# Patient Record
Sex: Female | Born: 1975 | Race: Black or African American | Hispanic: No | Marital: Married | Smoking: Current every day smoker
Health system: Southern US, Community
[De-identification: ages and names within clinical notes are randomized; demographics above are authoritative.]

## PROBLEM LIST (undated history)

## (undated) DIAGNOSIS — I10 Essential (primary) hypertension: Secondary | ICD-10-CM

## (undated) DIAGNOSIS — G4730 Sleep apnea, unspecified: Secondary | ICD-10-CM

## (undated) HISTORY — PX: ABDOMINAL HYSTERECTOMY: SHX81

---

## 2006-08-07 ENCOUNTER — Emergency Department: Payer: Self-pay | Admitting: Emergency Medicine

## 2009-09-21 ENCOUNTER — Ambulatory Visit: Payer: Self-pay

## 2016-03-30 ENCOUNTER — Inpatient Hospital Stay
Admission: EM | Admit: 2016-03-30 | Discharge: 2016-04-04 | DRG: 918 | Disposition: A | Discharge status: Home / Self Care | Payer: Self-pay | Attending: Internal Medicine | Admitting: Internal Medicine

## 2016-03-30 ENCOUNTER — Encounter: Payer: Self-pay | Admitting: Emergency Medicine

## 2016-03-30 ENCOUNTER — Emergency Department: Payer: Self-pay

## 2016-03-30 DIAGNOSIS — I248 Other forms of acute ischemic heart disease: Secondary | ICD-10-CM | POA: Diagnosis present

## 2016-03-30 DIAGNOSIS — R079 Chest pain, unspecified: Secondary | ICD-10-CM

## 2016-03-30 DIAGNOSIS — F1410 Cocaine abuse, uncomplicated: Secondary | ICD-10-CM | POA: Diagnosis present

## 2016-03-30 DIAGNOSIS — R7989 Other specified abnormal findings of blood chemistry: Secondary | ICD-10-CM

## 2016-03-30 DIAGNOSIS — Z885 Allergy status to narcotic agent status: Secondary | ICD-10-CM

## 2016-03-30 DIAGNOSIS — Z7982 Long term (current) use of aspirin: Secondary | ICD-10-CM

## 2016-03-30 DIAGNOSIS — Z833 Family history of diabetes mellitus: Secondary | ICD-10-CM

## 2016-03-30 DIAGNOSIS — F17200 Nicotine dependence, unspecified, uncomplicated: Secondary | ICD-10-CM | POA: Diagnosis present

## 2016-03-30 DIAGNOSIS — R778 Other specified abnormalities of plasma proteins: Secondary | ICD-10-CM

## 2016-03-30 DIAGNOSIS — M6282 Rhabdomyolysis: Secondary | ICD-10-CM | POA: Diagnosis present

## 2016-03-30 DIAGNOSIS — G4730 Sleep apnea, unspecified: Secondary | ICD-10-CM | POA: Diagnosis present

## 2016-03-30 DIAGNOSIS — R609 Edema, unspecified: Secondary | ICD-10-CM

## 2016-03-30 DIAGNOSIS — T405X1A Poisoning by cocaine, accidental (unintentional), initial encounter: Principal | ICD-10-CM | POA: Diagnosis present

## 2016-03-30 DIAGNOSIS — R29898 Other symptoms and signs involving the musculoskeletal system: Secondary | ICD-10-CM

## 2016-03-30 DIAGNOSIS — Z888 Allergy status to other drugs, medicaments and biological substances status: Secondary | ICD-10-CM

## 2016-03-30 DIAGNOSIS — E876 Hypokalemia: Secondary | ICD-10-CM | POA: Diagnosis present

## 2016-03-30 DIAGNOSIS — K219 Gastro-esophageal reflux disease without esophagitis: Secondary | ICD-10-CM | POA: Diagnosis present

## 2016-03-30 DIAGNOSIS — D72829 Elevated white blood cell count, unspecified: Secondary | ICD-10-CM | POA: Diagnosis not present

## 2016-03-30 DIAGNOSIS — Z8249 Family history of ischemic heart disease and other diseases of the circulatory system: Secondary | ICD-10-CM

## 2016-03-30 HISTORY — DX: Sleep apnea, unspecified: G47.30

## 2016-03-30 LAB — HEPATIC FUNCTION PANEL
ALT: 42 U/L (ref 14–54)
AST: 125 U/L — ABNORMAL HIGH (ref 15–41)
Albumin: 4.5 g/dL (ref 3.5–5.0)
Alkaline Phosphatase: 81 U/L (ref 38–126)
BILIRUBIN DIRECT: 0.1 mg/dL (ref 0.1–0.5)
BILIRUBIN INDIRECT: 1.3 mg/dL — AB (ref 0.3–0.9)
BILIRUBIN TOTAL: 1.4 mg/dL — AB (ref 0.3–1.2)
Total Protein: 8.2 g/dL — ABNORMAL HIGH (ref 6.5–8.1)

## 2016-03-30 LAB — CBC
HCT: 41.4 % (ref 35.0–47.0)
Hemoglobin: 14.1 g/dL (ref 12.0–16.0)
MCH: 29.2 pg (ref 26.0–34.0)
MCHC: 34 g/dL (ref 32.0–36.0)
MCV: 86 fL (ref 80.0–100.0)
PLATELETS: 327 10*3/uL (ref 150–440)
RBC: 4.81 MIL/uL (ref 3.80–5.20)
RDW: 13.6 % (ref 11.5–14.5)
WBC: 22.6 10*3/uL — ABNORMAL HIGH (ref 3.6–11.0)

## 2016-03-30 LAB — BASIC METABOLIC PANEL
Anion gap: 12 (ref 5–15)
BUN: 13 mg/dL (ref 6–20)
CO2: 21 mmol/L — ABNORMAL LOW (ref 22–32)
Calcium: 8.9 mg/dL (ref 8.9–10.3)
Chloride: 103 mmol/L (ref 101–111)
Creatinine, Ser: 0.93 mg/dL (ref 0.44–1.00)
GFR calc non Af Amer: >60 mL/min (ref >60)
Glucose, Bld: 108 mg/dL — ABNORMAL HIGH (ref 65–99)
Potassium: 2.7 mmol/L — CL (ref 3.5–5.1)
SODIUM: 136 mmol/L (ref 135–145)

## 2016-03-30 LAB — APTT: APTT: 27 s (ref 24–36)

## 2016-03-30 LAB — PROTIME-INR
INR: 1.17
PROTHROMBIN TIME: 15.1 s — AB (ref 11.4–15.0)

## 2016-03-30 LAB — POCT PREGNANCY, URINE: PREG TEST UR: NEGATIVE

## 2016-03-30 LAB — TROPONIN I: Troponin I: 0.39 ng/mL — ABNORMAL HIGH (ref <0.031)

## 2016-03-30 MED ORDER — NITROGLYCERIN 2 % TD OINT
1.0000 [in_us] | TOPICAL_OINTMENT | Freq: Once | TRANSDERMAL | Status: AC
  Administered 2016-03-30: 1 [in_us] via TOPICAL
  Filled 2016-03-30: qty 1

## 2016-03-30 MED ORDER — HEPARIN SODIUM (PORCINE) 5000 UNIT/ML IJ SOLN: 4000.0000 [IU] | Freq: Once | INTRAMUSCULAR | Status: DC

## 2016-03-30 MED ORDER — HEPARIN (PORCINE) IN NACL 100-0.45 UNIT/ML-% IJ SOLN: 10.0000 [IU]/kg/h | Freq: Once | INTRAMUSCULAR | Status: DC

## 2016-03-30 MED ORDER — ASPIRIN 81 MG PO CHEW
324.0000 mg | CHEWABLE_TABLET | Freq: Once | ORAL | Status: AC
  Administered 2016-03-30: 324 mg via ORAL
  Filled 2016-03-30: qty 4

## 2016-03-30 MED ORDER — HEPARIN (PORCINE) IN NACL 100-0.45 UNIT/ML-% IJ SOLN
1500.0000 [IU]/h | INTRAMUSCULAR | Status: DC
  Administered 2016-03-31: 1000 [IU]/h via INTRAVENOUS
  Administered 2016-03-31: 1300 [IU]/h via INTRAVENOUS
  Filled 2016-03-30 (×4): qty 250

## 2016-03-30 MED ORDER — HEPARIN BOLUS VIA INFUSION
4000.0000 [IU] | Freq: Once | INTRAVENOUS | Status: AC
  Administered 2016-03-31: 4000 [IU] via INTRAVENOUS
  Filled 2016-03-30: qty 4000

## 2016-03-30 MED ORDER — POTASSIUM CHLORIDE 20 MEQ/15ML (10%) PO SOLN
40.0000 meq | Freq: Once | ORAL | Status: AC
  Administered 2016-03-31: 40 meq via ORAL
  Filled 2016-03-30 (×2): qty 30

## 2016-03-30 NOTE — ED Notes (Signed)
Pt says this is the first time using cocaine "in years"; says she used because "it was just something to do to have a little fun";

## 2016-03-30 NOTE — ED Notes (Signed)
K+ 2.7, Trop I 0.39

## 2016-03-30 NOTE — ED Provider Notes (Addendum)
[facility] Emergency Department Provider Note  ____________________________________________  Time seen: Approximately 11:14 PM  I have reviewed the triage vital signs and the nursing notes.   HISTORY  Chief Complaint Drug Problem   HPI Holly Ponce is a 40 y.o. female patient reports she's been doing cocaine all day long and now has burning pain in her chest. She also says she can't move her legs. She seems really fairly disinterested about the whole thing cannot tell me anything that makes the burning pain worse or better. All of this started today. Last cocaine was about 6:00 this evening.   History reviewed. No pertinent past medical history.  There are no active problems to display for this patient.   History reviewed. No pertinent past surgical history.  No current outpatient prescriptions on file.  Allergies Tramadol and Vicodin  History reviewed. No pertinent family history.  Social History Social History  Substance Use Topics  . Smoking status: Never Smoker   . Smokeless tobacco: None  . Alcohol Use: No    Review of Systems Constitutional: No fever/chills Eyes: No visual changes. ENT: No sore throat. Cardiovascular:  chest pain. Respiratory: Denies shortness of breath. Gastrointestinal: No abdominal pain.  No nausea, no vomiting.  No diarrhea.  No constipation. Genitourinary: Negative for dysuria. Musculoskeletal: Negative for back pain. Skin: Negative for rash. Neurological: Negative for headaches, No focal weakness except for the fact patient says she can't move either leg.Marland Kitchen  10-point ROS otherwise negative.  ____________________________________________   PHYSICAL EXAM:  VITAL SIGNS: ED Triage Vitals  Enc Vitals Group     BP 03/30/16 2110 155/93 mmHg     Pulse Rate 03/30/16 2110 98     Resp 03/30/16 2110 20     Temp 03/30/16 2110 98.6 F (37 C)     Temp Source 03/30/16 2110 Oral     SpO2 03/30/16 2110 97 %      Weight 03/30/16 2110 320 lb (145.151 kg)     Height 03/30/16 2110 5\' 5"  (1.651 m)     Head Cir --      Peak Flow --      Pain Score 03/30/16 2235 5     Pain Loc --      Pain Edu? --      Excl. in [place]? --    Constitutional: Alert and oriented. Well appearing and in no acute distress. Eyes: Conjunctivae are normal. PERRL. EOMI. Head: Atraumatic. Nose: No congestion/rhinnorhea. Mouth/Throat: Mucous membranes are moist.  Oropharynx non-erythematous. Neck: No stridor.  Cardiovascular: Normal rate, regular rhythm. Grossly normal heart sounds.  Good peripheral circulation. Respiratory: Normal respiratory effort.  No retractions. Lungs CTAB. Gastrointestinal: Soft and nontender. No distention. No abdominal bruits. No CVA tenderness. Musculoskeletal: Patient reports she cannot move her legs she does have intact deep tendon reflexes in the knees and ankles and toes are downgoing bilaterally. No joint effusions. Neurologic:  Normal speech and language. We'll strength in the arms. Skin:  Skin is warm, dry and intact. No rash noted.   ____________________________________________   LABS (all labs ordered are listed, but only abnormal results are displayed)  Labs Reviewed  CBC - Abnormal; Notable for the following:    WBC 22.6 (*)    All other components within normal limits  BASIC METABOLIC PANEL - Abnormal; Notable for the following:    Potassium 2.7 (*)    CO2 21 (*)    Glucose, Bld 108 (*)    All other components within normal  limits  TROPONIN I - Abnormal; Notable for the following:    Troponin I 0.39 (*)    All other components within normal limits  URINE DRUG SCREEN, QUALITATIVE ([facility] ONLY)  CK  PROTIME-INR  APTT  HEPATIC FUNCTION PANEL  PREGNANCY, URINE  URINALYSIS COMPLETEWITH MICROSCOPIC ([facility] ONLY)   ____________________________________________  Daylene Posey and interpreted by me shows that There appears to be some elevation in lead 1 and at least some of the complexes but an  not in any other leads. ____________________________________________  RADIOLOGY  ____________________________________________   PROCEDURES  Discussed patient with Dr. she cannot contract recommends heparin and aspirin and nitroglycerin he will consult in the morning.  ____________________________________________   INITIAL IMPRESSION / ASSESSMENT AND PLAN / ED COURSE  Pertinent labs & imaging results that were available during my care of the patient were reviewed by me and considered in my medical decision making (see chart for details).   ____________________________________________   FINAL CLINICAL IMPRESSION(S) / ED DIAGNOSES  Final diagnoses:  Chest pain, unspecified chest pain type  Elevated troponin  Cocaine abuse  Weakness of lower extremity, unspecified laterality      Nena Polio, MD 03/30/16 [id number]  Nena Polio, MD 04/09/16 1520

## 2016-03-30 NOTE — ED Notes (Signed)
Came in saying she overdosed on cocaine - last used 2 hrs ago. Alert, oriented. States her legs feel numb

## 2016-03-30 NOTE — ED Notes (Signed)
States first time using cocaine. Denies trying to hurt herself

## 2016-03-30 NOTE — Progress Notes (Signed)
ANTICOAGULATION CONSULT NOTE - Initial Consult  Pharmacy Consult for heparin Indication: chest pain/ACS  Allergies  Allergen Reactions  . Tramadol   . Vicodin [Hydrocodone-Acetaminophen]     Patient Measurements: Height: 5\' 5"  (165.1 cm) Weight: (!) 320 lb (145.151 kg) IBW/kg (Calculated) : 57 Heparin Dosing Weight: 93.4 kg  Vital Signs: Temp: 98.6 F (37 C) (04/16 2110) Temp Source: Oral (04/16 2110) BP: 120/93 mmHg (04/16 2323) Pulse Rate: 88 (04/16 2323)  Labs:  Recent Labs  03/30/16 2130  HGB 14.1  HCT 41.4  PLT 327  CREATININE 0.93  TROPONINI 0.39*    Estimated Creatinine Clearance: 119.5 mL/min (by C-G formula based on Cr of 0.93).   Medical History: History reviewed. No pertinent past medical history.  Medications:  Infusions:  . heparin      Assessment: 39 yof cc SUD. Recent cocaine use per patient, cardiology recommends heparin, troponin 0.39. Pharmacy consulted to dose heparin drip.   Goal of Therapy:  Heparin level 0.3-0.7 units/ml Monitor platelets by anticoagulation protocol: Yes   Plan:  Give 4000 units bolus x 1 Start heparin infusion at 1000 units/hr Check anti-Xa level in 6 hours and daily while on heparin Continue to monitor H&H and platelets  Laural Benes, Pharm.D., BCPS Clinical Pharmacist 03/30/2016,11:25 PM

## 2016-03-31 ENCOUNTER — Inpatient Hospital Stay: Payer: Self-pay

## 2016-03-31 DIAGNOSIS — M6282 Rhabdomyolysis: Secondary | ICD-10-CM | POA: Diagnosis present

## 2016-03-31 LAB — COMPREHENSIVE METABOLIC PANEL
ALT: 41 U/L (ref 14–54)
AST: 136 U/L — AB (ref 15–41)
Albumin: 3.8 g/dL (ref 3.5–5.0)
Alkaline Phosphatase: 68 U/L (ref 38–126)
Anion gap: 9 (ref 5–15)
BUN: 12 mg/dL (ref 6–20)
CHLORIDE: 108 mmol/L (ref 101–111)
CO2: 20 mmol/L — ABNORMAL LOW (ref 22–32)
CREATININE: 0.66 mg/dL (ref 0.44–1.00)
Calcium: 7.8 mg/dL — ABNORMAL LOW (ref 8.9–10.3)
GFR calc Af Amer: >60 mL/min (ref >60)
Glucose, Bld: 99 mg/dL (ref 65–99)
Potassium: 3 mmol/L — ABNORMAL LOW (ref 3.5–5.1)
Sodium: 137 mmol/L (ref 135–145)
Total Bilirubin: 1 mg/dL (ref 0.3–1.2)
Total Protein: 6.8 g/dL (ref 6.5–8.1)

## 2016-03-31 LAB — URINALYSIS COMPLETE WITH MICROSCOPIC (ARMC ONLY)
Bilirubin Urine: NEGATIVE
GLUCOSE, UA: NEGATIVE mg/dL
Leukocytes, UA: NEGATIVE
Nitrite: NEGATIVE
PROTEIN: 100 mg/dL — AB
SPECIFIC GRAVITY, URINE: 1.023 (ref 1.005–1.030)
pH: 5 (ref 5.0–8.0)

## 2016-03-31 LAB — URINE DRUG SCREEN, QUALITATIVE (ARMC ONLY)
AMPHETAMINES, UR SCREEN: NOT DETECTED
BARBITURATES, UR SCREEN: NOT DETECTED
BENZODIAZEPINE, UR SCRN: NOT DETECTED
COCAINE METABOLITE, UR ~~LOC~~: POSITIVE — AB
Cannabinoid 50 Ng, Ur ~~LOC~~: NOT DETECTED
MDMA (Ecstasy)Ur Screen: NOT DETECTED
Methadone Scn, Ur: NOT DETECTED
OPIATE, UR SCREEN: NOT DETECTED
PHENCYCLIDINE (PCP) UR S: NOT DETECTED
Tricyclic, Ur Screen: NOT DETECTED

## 2016-03-31 LAB — CBC
HEMATOCRIT: 35.8 % (ref 35.0–47.0)
HEMOGLOBIN: 12.1 g/dL (ref 12.0–16.0)
MCH: 28.8 pg (ref 26.0–34.0)
MCHC: 33.8 g/dL (ref 32.0–36.0)
MCV: 85.2 fL (ref 80.0–100.0)
PLATELETS: 299 10*3/uL (ref 150–440)
RBC: 4.2 MIL/uL (ref 3.80–5.20)
RDW: 13.9 % (ref 11.5–14.5)
WBC: 19 10*3/uL — AB (ref 3.6–11.0)

## 2016-03-31 LAB — CK
CK TOTAL: 11215 U/L — AB (ref 38–234)
Total CK: 11774 U/L — ABNORMAL HIGH (ref 38–234)

## 2016-03-31 LAB — GLUCOSE, CAPILLARY: GLUCOSE-CAPILLARY: 93 mg/dL (ref 65–99)

## 2016-03-31 LAB — HEPARIN LEVEL (UNFRACTIONATED)
HEPARIN UNFRACTIONATED: 0.11 [IU]/mL — AB (ref 0.30–0.70)
HEPARIN UNFRACTIONATED: 0.28 [IU]/mL — AB (ref 0.30–0.70)
Heparin Unfractionated: 0.2 IU/mL — ABNORMAL LOW (ref 0.30–0.70)

## 2016-03-31 LAB — PROTIME-INR
INR: 1.26
PROTHROMBIN TIME: 15.9 s — AB (ref 11.4–15.0)

## 2016-03-31 LAB — MAGNESIUM: MAGNESIUM: 2 mg/dL (ref 1.7–2.4)

## 2016-03-31 LAB — APTT: aPTT: 35 seconds (ref 24–36)

## 2016-03-31 MED ORDER — HEPARIN BOLUS VIA INFUSION
1400.0000 [IU] | Freq: Once | INTRAVENOUS | Status: AC
  Administered 2016-03-31: 1400 [IU] via INTRAVENOUS
  Filled 2016-03-31: qty 1400

## 2016-03-31 MED ORDER — ASPIRIN EC 81 MG PO TBEC
81.0000 mg | DELAYED_RELEASE_TABLET | Freq: Every day | ORAL | Status: DC
  Administered 2016-03-31 – 2016-04-04 (×5): 81 mg via ORAL
  Filled 2016-03-31 (×5): qty 1

## 2016-03-31 MED ORDER — NITROGLYCERIN 2 % TD OINT: 0.5000 [in_us] | TOPICAL_OINTMENT | Freq: Four times a day (QID) | TRANSDERMAL | Status: DC

## 2016-03-31 MED ORDER — SODIUM CHLORIDE 0.9 % IV SOLN
INTRAVENOUS | Status: DC
  Administered 2016-03-31 – 2016-04-04 (×12): via INTRAVENOUS

## 2016-03-31 MED ORDER — POTASSIUM CHLORIDE CRYS ER 20 MEQ PO TBCR
40.0000 meq | EXTENDED_RELEASE_TABLET | Freq: Once | ORAL | Status: AC
  Administered 2016-03-31: 40 meq via ORAL
  Filled 2016-03-31: qty 2

## 2016-03-31 MED ORDER — ONDANSETRON HCL 4 MG PO TABS: 4.0000 mg | ORAL_TABLET | Freq: Four times a day (QID) | ORAL | Status: DC | PRN

## 2016-03-31 MED ORDER — HEPARIN (PORCINE) IN NACL 100-0.45 UNIT/ML-% IJ SOLN
1650.0000 [IU]/h | INTRAMUSCULAR | Status: DC
Start: 2016-03-31 — End: 2016-04-01
  Administered 2016-04-01: 1650 [IU]/h via INTRAVENOUS
  Filled 2016-03-31: qty 250

## 2016-03-31 MED ORDER — ONDANSETRON HCL 4 MG/2ML IJ SOLN
4.0000 mg | Freq: Four times a day (QID) | INTRAMUSCULAR | Status: DC | PRN
Start: 2016-03-31 — End: 2016-04-04
  Administered 2016-04-01: 4 mg via INTRAVENOUS
  Filled 2016-03-31: qty 2

## 2016-03-31 MED ORDER — SODIUM CHLORIDE 0.9% FLUSH
3.0000 mL | Freq: Two times a day (BID) | INTRAVENOUS | Status: DC
  Administered 2016-03-31 – 2016-04-02 (×5): 3 mL via INTRAVENOUS

## 2016-03-31 MED ORDER — HEPARIN BOLUS VIA INFUSION
2800.0000 [IU] | Freq: Once | INTRAVENOUS | Status: AC
  Administered 2016-03-31: 2800 [IU] via INTRAVENOUS
  Filled 2016-03-31: qty 2800

## 2016-03-31 MED ORDER — SODIUM CHLORIDE 0.9 % IV SOLN
Freq: Once | INTRAVENOUS | Status: AC
  Administered 2016-03-31: via INTRAVENOUS

## 2016-03-31 MED ORDER — ADULT MULTIVITAMIN W/MINERALS CH
1.0000 | ORAL_TABLET | Freq: Every day | ORAL | Status: DC
  Administered 2016-03-31 – 2016-04-04 (×5): 1 via ORAL
  Filled 2016-03-31 (×5): qty 1

## 2016-03-31 NOTE — Progress Notes (Signed)
Holly Ponce is a 40 y.o. female  [id number]  Primary Cardiologist: Neoma Laming Reason for Consultation: Chest pain  HPI: 40 year old African-American female with a past medical history of cocaine use presented to the hospital with chest pain and leg pain. EKG was mildly abnormal normal with some ST changes and elevated troponin along with CPK thus I was asked to evaluate the patient.   Review of Systems: No orthopnea PND or leg swelling   Past Medical History  Diagnosis Date  . Sleep apnea     Medications Prior to Admission  Medication Sig Dispense Refill  . Multiple Vitamin (MULTIVITAMIN) tablet Take 1 tablet by mouth daily.       Marland Kitchen aspirin EC  81 mg Oral Daily  . multivitamin with minerals  1 tablet Oral Daily  . sodium chloride flush  3 mL Intravenous Q12H    Infusions: . sodium chloride 150 mL/hr at 03/31/16 0151  . heparin 1,300 Units/hr (03/31/16 0631)    Allergies  Allergen Reactions  . Tramadol   . Vicodin [Hydrocodone-Acetaminophen]     Social History   Social History  . Marital Status: Married    Spouse Name: N/A  . Number of Children: N/A  . Years of Education: N/A   Occupational History  . Not on file.   Social History Main Topics  . Smoking status: Never Smoker   . Smokeless tobacco: Not on file  . Alcohol Use: No  . Drug Use: Not on file  . Sexual Activity: Yes    Birth Control/ Protection: IUD   Other Topics Concern  . Not on file   Social History Narrative    History reviewed. No pertinent family history.  PHYSICAL EXAM: Filed Vitals:   03/31/16 0455 03/31/16 0800  BP: 125/65 119/79  Pulse: 81 83  Temp: 98.4 F (36.9 C) 98.2 F (36.8 C)  Resp: 16 17     Intake/Output Summary (Last 24 hours) at 03/31/16 0821 Last data filed at 03/31/16 0631  Gross per 24 hour  Intake   2740 ml  Output    300 ml  Net   2440 ml    General:  Well appearing. No respiratory difficulty HEENT: normal Neck: supple. no JVD. Carotids  2+ bilat; no bruits. No lymphadenopathy or thryomegaly appreciated. Cor: PMI nondisplaced. Regular rate & rhythm. No rubs, gallops or murmurs. Lungs: clear Abdomen: soft, nontender, nondistended. No hepatosplenomegaly. No bruits or masses. Good bowel sounds. Extremities: no cyanosis, clubbing, rash, edema Neuro: alert & oriented x 3, cranial nerves grossly intact. moves all 4 extremities w/o difficulty. Affect pleasant.  [id number] rhythm with minor ST changes in the lateral leads  Results for orders placed or performed during the hospital encounter of 03/30/16 (from the past 24 hour(s))  CBC     Status: Abnormal   Collection Time: 03/30/16  9:30 PM  Result Value Ref Range   WBC 22.6 (H) 3.6 - 11.0 K/uL   RBC 4.81 3.80 - 5.20 MIL/uL   Hemoglobin 14.1 12.0 - 16.0 g/dL   HCT 41.4 35.0 - 47.0 %   MCV 86.0 80.0 - 100.0 fL   MCH 29.2 26.0 - 34.0 pg   MCHC 34.0 32.0 - 36.0 g/dL   RDW 13.6 11.5 - 14.5 %   Platelets 327 150 - 440 K/uL  Basic metabolic panel     Status: Abnormal   Collection Time: 03/30/16  9:30 PM  Result Value Ref Range   Sodium 136 135 - 145  mmol/L   Potassium 2.7 (LL) 3.5 - 5.1 mmol/L   Chloride 103 101 - 111 mmol/L   CO2 21 (L) 22 - 32 mmol/L   Glucose, Bld 108 (H) 65 - 99 mg/dL   BUN 13 6 - 20 mg/dL   Creatinine, Ser 0.93 0.44 - 1.00 mg/dL   Calcium 8.9 8.9 - 10.3 mg/dL   GFR calc non Af Amer >60 >60 mL/min   GFR calc Af Amer >60 >60 mL/min   Anion gap 12 5 - 15  Troponin I     Status: Abnormal   Collection Time: 03/30/16  9:30 PM  Result Value Ref Range   Troponin I 0.39 (H) <0.031 ng/mL  CK     Status: Abnormal   Collection Time: 03/30/16  9:30 PM  Result Value Ref Range   Total CK 11215 (H) 38 - 234 U/L  Protime-INR     Status: Abnormal   Collection Time: 03/30/16  9:30 PM  Result Value Ref Range   Prothrombin Time 15.1 (H) 11.4 - 15.0 seconds   INR 1.17   APTT     Status: None   Collection Time: 03/30/16  9:30 PM  Result Value Ref Range   aPTT 27  24 - 36 seconds  Hepatic function panel     Status: Abnormal   Collection Time: 03/30/16  9:30 PM  Result Value Ref Range   Total Protein 8.2 (H) 6.5 - 8.1 g/dL   Albumin 4.5 3.5 - 5.0 g/dL   AST 125 (H) 15 - 41 U/L   ALT 42 14 - 54 U/L   Alkaline Phosphatase 81 38 - 126 U/L   Total Bilirubin 1.4 (H) 0.3 - 1.2 mg/dL   Bilirubin, Direct 0.1 0.1 - 0.5 mg/dL   Indirect Bilirubin 1.3 (H) 0.3 - 0.9 mg/dL  Urine Drug Screen, Qualitative ([facility] only)     Status: Abnormal   Collection Time: 03/30/16 11:47 PM  Result Value Ref Range   Tricyclic, Ur Screen NONE DETECTED NONE DETECTED   Amphetamines, Ur Screen NONE DETECTED NONE DETECTED   MDMA (Ecstasy)Ur Screen NONE DETECTED NONE DETECTED   Cocaine Metabolite,Ur [place] POSITIVE (A) NONE DETECTED   Opiate, Ur Screen NONE DETECTED NONE DETECTED   Phencyclidine (PCP) Ur S NONE DETECTED NONE DETECTED   Cannabinoid 50 Ng, Ur [place] NONE DETECTED NONE DETECTED   Barbiturates, Ur Screen NONE DETECTED NONE DETECTED   Benzodiazepine, Ur Scrn NONE DETECTED NONE DETECTED   Methadone Scn, Ur NONE DETECTED NONE DETECTED  Urinalysis complete, with microscopic     Status: Abnormal   Collection Time: 03/30/16 11:47 PM  Result Value Ref Range   Color, Urine YELLOW (A) YELLOW   APPearance HAZY (A) CLEAR   Glucose, UA NEGATIVE NEGATIVE mg/dL   Bilirubin Urine NEGATIVE NEGATIVE   Ketones, ur 2+ (A) NEGATIVE mg/dL   Specific Gravity, Urine 1.023 1.005 - 1.030   Hgb urine dipstick 2+ (A) NEGATIVE   pH 5.0 5.0 - 8.0   Protein, ur 100 (A) NEGATIVE mg/dL   Nitrite NEGATIVE NEGATIVE   Leukocytes, UA NEGATIVE NEGATIVE   RBC / HPF 0-5 0 - 5 RBC/hpf   WBC, UA 0-5 0 - 5 WBC/hpf   Bacteria, UA RARE (A) NONE SEEN   Squamous Epithelial / LPF 0-5 (A) NONE SEEN   Mucous PRESENT    Hyaline Casts, UA PRESENT    Granular Casts, UA PRESENT   Pregnancy, urine POC     Status: None   Collection Time:  03/30/16 11:52 PM  Result Value Ref Range   Preg Test, Ur NEGATIVE NEGATIVE   CK     Status: Abnormal   Collection Time: 03/31/16  2:22 AM  Result Value Ref Range   Total CK 11774 (H) 38 - 234 U/L  Heparin level (unfractionated)     Status: Abnormal   Collection Time: 03/31/16  4:43 AM  Result Value Ref Range   Heparin Unfractionated 0.11 (L) 0.30 - 0.70 IU/mL  CBC     Status: Abnormal   Collection Time: 03/31/16  4:43 AM  Result Value Ref Range   WBC 19.0 (H) 3.6 - 11.0 K/uL   RBC 4.20 3.80 - 5.20 MIL/uL   Hemoglobin 12.1 12.0 - 16.0 g/dL   HCT 35.8 35.0 - 47.0 %   MCV 85.2 80.0 - 100.0 fL   MCH 28.8 26.0 - 34.0 pg   MCHC 33.8 32.0 - 36.0 g/dL   RDW 13.9 11.5 - 14.5 %   Platelets 299 150 - 440 K/uL  Comprehensive metabolic panel     Status: Abnormal   Collection Time: 03/31/16  4:43 AM  Result Value Ref Range   Sodium 137 135 - 145 mmol/L   Potassium 3.0 (L) 3.5 - 5.1 mmol/L   Chloride 108 101 - 111 mmol/L   CO2 20 (L) 22 - 32 mmol/L   Glucose, Bld 99 65 - 99 mg/dL   BUN 12 6 - 20 mg/dL   Creatinine, Ser 0.66 0.44 - 1.00 mg/dL   Calcium 7.8 (L) 8.9 - 10.3 mg/dL   Total Protein 6.8 6.5 - 8.1 g/dL   Albumin 3.8 3.5 - 5.0 g/dL   AST 136 (H) 15 - 41 U/L   ALT 41 14 - 54 U/L   Alkaline Phosphatase 68 38 - 126 U/L   Total Bilirubin 1.0 0.3 - 1.2 mg/dL   GFR calc non Af Amer >60 >60 mL/min   GFR calc Af Amer >60 >60 mL/min   Anion gap 9 5 - 15  Protime-INR     Status: Abnormal   Collection Time: 03/31/16  4:43 AM  Result Value Ref Range   Prothrombin Time 15.9 (H) 11.4 - 15.0 seconds   INR 1.26   APTT     Status: None   Collection Time: 03/31/16  4:43 AM  Result Value Ref Range   aPTT 35 24 - 36 seconds  Magnesium     Status: None   Collection Time: 03/31/16  4:43 AM  Result Value Ref Range   Magnesium 2.0 1.7 - 2.4 mg/dL  Glucose, capillary     Status: None   Collection Time: 03/31/16  7:42 AM  Result Value Ref Range   Glucose-Capillary 93 65 - 99 mg/dL   Comment 1 Notify RN    Dg Chest Portable 1 View  03/31/2016  CLINICAL DATA:   Initial evaluation for acute chest pain. EXAM: PORTABLE CHEST 1 VIEW COMPARISON:  None. FINDINGS: Cardiac and mediastinal silhouettes are within normal limits. Elevation the right hemidiaphragm. Associated mild right basilar atelectasis. No consolidative airspace disease. No pulmonary edema or pleural effusion. No pneumothorax. No acute osseus abnormality. IMPRESSION: Elevation of the right hemidiaphragm with associated right basilar atelectasis/ bronchovascular crowding. No other active cardiopulmonary disease. Electronically Signed   By: Jeannine Boga M.D.   On: 03/31/2016 00:31     ASSESSMENT AND PLAN: Rhabdomyolysis with mildly elevated troponin. Patient probably has this due to rhabdomyolysis and will do echocardiogram to look at wall motion and ejection  fraction. Patient denies any chest pain at this time. Advise correcting the potassium which is low and advise giving IV fluid for elevated CPK.  KHAN,SHAUKAT A

## 2016-03-31 NOTE — Progress Notes (Signed)
[facility] [place] NAME: Holly Ponce    MR#:  [id number]  DATE OF BIRTH:  03-Apr-1976  SUBJECTIVE:  CHIEF COMPLAINT:   Chief Complaint  Patient presents with  . Drug Problem   Chest pain is better, but has lower body aching and unable to move legs. REVIEW OF SYSTEMS:  CONSTITUTIONAL: No fever, has weakness.  EYES: No blurred or double vision.  EARS, NOSE, AND THROAT: No tinnitus or ear pain.  RESPIRATORY: No cough, shortness of breath, wheezing or hemoptysis.  CARDIOVASCULAR: has chest pain, no orthopnea, edema.  GASTROINTESTINAL: No nausea, vomiting, diarrhea or abdominal pain.  GENITOURINARY: No dysuria, hematuria.  ENDOCRINE: No polyuria, nocturia,  HEMATOLOGY: No anemia, easy bruising or bleeding SKIN: No rash or lesion. MUSCULOSKELETAL: No joint pain or arthritis. But has muscle aching.  NEUROLOGIC: No tingling, numbness, weakness.  PSYCHIATRY: No anxiety or depression.   DRUG ALLERGIES:   Allergies  Allergen Reactions  . Tramadol   . Vicodin [Hydrocodone-Acetaminophen]     VITALS:  Blood pressure 119/82, pulse 83, temperature 97.9 F (36.6 C), temperature source Oral, resp. rate 20, height 5\' 5"  (1.651 m), weight 142.883 kg (315 lb), SpO2 95 %.  PHYSICAL EXAMINATION:  GENERAL:  40 y.o.-year-old patient lying in the bed with no acute distress. Morbid obese. EYES: Pupils equal, round, reactive to light and accommodation. No scleral icterus. Extraocular muscles intact.  HEENT: Head atraumatic, normocephalic. Oropharynx and nasopharynx clear.  NECK:  Supple, no jugular venous distention. No thyroid enlargement, no tenderness.  LUNGS: Normal breath sounds bilaterally, no wheezing, rales,rhonchi or crepitation. No use of accessory muscles of respiration.  CARDIOVASCULAR: S1, S2 normal. No murmurs, rubs, or gallops.  ABDOMEN: Soft, nontender, nondistended. Bowel sounds present. No organomegaly or mass.  EXTREMITIES:  No pedal edema, cyanosis, or clubbing.  NEUROLOGIC: Cranial nerves II through XII are intact. Muscle strength 2/5 in all extremities. Sensation intact. Gait not checked.  PSYCHIATRIC: The patient is alert and oriented x 3.  SKIN: No obvious rash, lesion, or ulcer.    LABORATORY PANEL:   CBC  Recent Labs Lab 03/31/16 0443  WBC 19.0*  HGB 12.1  HCT 35.8  PLT 299   ------------------------------------------------------------------------------------------------------------------  Chemistries   Recent Labs Lab 03/31/16 0443  NA 137  K 3.0*  CL 108  CO2 20*  GLUCOSE 99  BUN 12  CREATININE 0.66  CALCIUM 7.8*  MG 2.0  AST 136*  ALT 41  ALKPHOS 68  BILITOT 1.0   ------------------------------------------------------------------------------------------------------------------  Cardiac Enzymes  Recent Labs Lab 03/30/16 2130  TROPONINI 0.39*   ------------------------------------------------------------------------------------------------------------------  RADIOLOGY:  US Venous Img Lower Bilateral  03/31/2016  CLINICAL DATA:  Bilateral lower extremity pain and edema. History of drug abuse. Evaluate for DVT. EXAM: BILATERAL LOWER EXTREMITY VENOUS DOPPLER ULTRASOUND TECHNIQUE: Gray-scale sonography with graded compression, as well as color Doppler and duplex ultrasound were performed to evaluate the lower extremity deep venous systems from the level of the common femoral vein and including the common femoral, femoral, profunda femoral, popliteal and calf veins including the posterior tibial, peroneal and gastrocnemius veins when visible. The superficial great saphenous vein was also interrogated. Spectral Doppler was utilized to evaluate flow at rest and with distal augmentation maneuvers in the common femoral, femoral and popliteal veins. COMPARISON:  None. FINDINGS: RIGHT LOWER EXTREMITY Common Femoral Vein: No evidence of thrombus. Normal compressibility, respiratory  phasicity and response to augmentation. Saphenofemoral Junction: No evidence of thrombus. Normal compressibility and flow  on color Doppler imaging. Profunda Femoral Vein: No evidence of thrombus. Normal compressibility and flow on color Doppler imaging. Femoral Vein: No evidence of thrombus. Normal compressibility, respiratory phasicity and response to augmentation. Popliteal Vein: No evidence of thrombus. Normal compressibility, respiratory phasicity and response to augmentation. Calf Veins: No evidence of thrombus. Normal compressibility and flow on color Doppler imaging. Superficial Great Saphenous Vein: No evidence of thrombus. Normal compressibility and flow on color Doppler imaging. Venous Reflux:  None. Other Findings:  None. LEFT LOWER EXTREMITY Common Femoral Vein: No evidence of thrombus. Normal compressibility, respiratory phasicity and response to augmentation. Saphenofemoral Junction: No evidence of thrombus. Normal compressibility and flow on color Doppler imaging. Profunda Femoral Vein: No evidence of thrombus. Normal compressibility and flow on color Doppler imaging. Femoral Vein: No evidence of thrombus. Normal compressibility, respiratory phasicity and response to augmentation. Popliteal Vein: No evidence of thrombus. Normal compressibility, respiratory phasicity and response to augmentation. Calf Veins: No evidence of thrombus. Normal compressibility and flow on color Doppler imaging. Superficial Great Saphenous Vein: No evidence of thrombus. Normal compressibility and flow on color Doppler imaging. Venous Reflux:  None. Other Findings:  None. IMPRESSION: No evidence of DVT within either lower extremity. Electronically Signed   By: Sandi Mariscal M.D.   On: 03/31/2016 12:30   Dg Chest Portable 1 View  03/31/2016  CLINICAL DATA:  Initial evaluation for acute chest pain. EXAM: PORTABLE CHEST 1 VIEW COMPARISON:  None. FINDINGS: Cardiac and mediastinal silhouettes are within normal limits. Elevation  the right hemidiaphragm. Associated mild right basilar atelectasis. No consolidative airspace disease. No pulmonary edema or pleural effusion. No pneumothorax. No acute osseus abnormality. IMPRESSION: Elevation of the right hemidiaphragm with associated right basilar atelectasis/ bronchovascular crowding. No other active cardiopulmonary disease. Electronically Signed   By: Jeannine Boga M.D.   On: 03/31/2016 00:31    EKG:   Orders placed or performed during the hospital encounter of 03/30/16  . EKG 12-Lead  . EKG 12-Lead    ASSESSMENT AND PLAN:   40 year old female with severe rhabdomyolysis  * Severe rhabdomyolysis:  - Due to cocaine abuse - [place] 117[address]. continue aggressive IV hydration, f/u CK.  negative lower extremity Doppler.  * Chest pain, due to cocaine abuse Continue  full dose anticoagulation along with aspirin due to some EKG changes - Minor ST elevation in lead 1 per Cardiology recommendation.  on nitroglycerin prn.  Elevated troponin, due to rhabdomyolysis  F/u echo bubble study per Dr. Argie Ramming.  * Severe hypokalemia - Replete KCl, f/u BMP. Normal Magnesium.  * Leukocytosis. Improving. Possible due to reaction to cocaine. F/u CBC.  All the records are reviewed and case discussed with Care Management/Social Workerr. Management plans discussed with the patient, family and they are in agreement.  CODE STATUS: full code.  TOTAL TIME TAKING CARE OF THIS PATIENT: 39 minutes.  Greater than 50% time was spent on coordination of care and face-to-face counseling.  POSSIBLE D/C IN 3 DAYS, DEPENDING ON CLINICAL CONDITION.   Demetrios Loll M.D on 03/31/2016 at 4:24 PM  Between 7am to 6pm - Pager - [phone]  After 6pm go to [email or web address] - password [email or web address] [facility]  Office  [phone]  CC: Primary care physician; Gayland Curry, MD

## 2016-03-31 NOTE — Progress Notes (Signed)
To cardiovascular testing

## 2016-03-31 NOTE — Progress Notes (Signed)
ANTICOAGULATION CONSULT NOTE - Initial Consult  Pharmacy Consult for heparin Indication: chest pain/ACS  Allergies  Allergen Reactions  . Tramadol   . Vicodin [Hydrocodone-Acetaminophen]     Patient Measurements: Height: 5\' 5"  (165.1 cm) Weight: (!) 315 lb (142.883 kg) IBW/kg (Calculated) : 57 Heparin Dosing Weight: 93.4 kg  Vital Signs: Temp: 98.4 F (36.9 C) (04/17 0455) Temp Source: Oral (04/17 0455) BP: 125/65 mmHg (04/17 0455) Pulse Rate: 81 (04/17 0455)  Labs:  Recent Labs  03/30/16 2130 03/31/16 0222 03/31/16 0443  HGB 14.1  --  12.1  HCT 41.4  --  35.8  PLT 327  --  299  APTT 27  --  35  LABPROT 15.1*  --  15.9*  INR 1.17  --  1.26  HEPARINUNFRC  --   --  0.11*  CREATININE 0.93  --  0.66  CKTOTAL 52841* 11774*  --   TROPONINI 0.39*  --   --     Estimated Creatinine Clearance: 137.6 mL/min (by C-G formula based on Cr of 0.66).   Medical History: Past Medical History  Diagnosis Date  . Sleep apnea     Medications:  Infusions:  . sodium chloride 150 mL/hr at 03/31/16 0151  . heparin 1,000 Units/hr (03/31/16 0030)    Assessment: 11 yof cc SUD. Recent cocaine use per patient, cardiology recommends heparin, troponin 0.39. Pharmacy consulted to dose heparin drip.   Goal of Therapy:  Heparin level 0.3-0.7 units/ml Monitor platelets by anticoagulation protocol: Yes   Plan:  Heparin level subtherapeutic x 1. Made sure with RN that heparin was running appropriately. 2800 unit IV x 1 bolus and increase rate to 1300 units/hr. Will recheck level in 6 hours.  Laural Benes, Pharm.D., BCPS Clinical Pharmacist 03/31/2016,6:22 AM

## 2016-03-31 NOTE — H&P (Signed)
[facility] [place] NAME: Holly Ponce    MR#:  [id number]  DATE OF BIRTH:  August 26, 1976  DATE OF ADMISSION:  03/30/2016  PRIMARY CARE PHYSICIAN: Gayland Curry, MD   REQUESTING/REFERRING PHYSICIAN: Nena Polio, MD  CHIEF COMPLAINT:   Chief Complaint  Patient presents with  . Drug Problem    HISTORY OF PRESENT ILLNESS:  Holly Ponce  is a 40 y.o. female with a known history of GERD and sleep apnea is being admitted for severe rhabdomyolysis after cocaine abuse.  Patient has been smoking cocaine all last night and during the day and started having burning chest pain around 2 PM yesterday did not get relieved and finally decided to come to the emergency department. She also says she can't move her legs.  In the emergency department, she was given nitroglycerin which seemed to have relieved her pain, but she is very nauseous.  Could not tolerate oral medication. PAST MEDICAL HISTORY:  History reviewed. No pertinent past medical history. . Dysmenorrhea  . Infertility management  Tubal infertility  . PID (pelvic inflammatory disease)  . Fibroid  . Hydrosalpinx  . GERD (gastroesophageal reflux disease)  acid comes up while laying flat; not taking medication for this  . Sleep apnea  CPAP; newly diagnosed  PAST SURGICAL HISTORY:  History reviewed. No pertinent past surgical history. . Pelvic laparoscopy 2006  ectopic, possible salpingostomy  . Laparoscopic myomectomy robotic N/A 09/26/2014  SOCIAL HISTORY:   Social History  Substance Use Topics  . Smoking status: Never Smoker   . Smokeless tobacco: Not on file  . Alcohol Use: No  She is a former smoker She is a Engineer, manufacturing at [employer] She lives with her husband FAMILY HISTORY:  History reviewed. No pertinent family history. Diabetes mellitus in her brother and mother; Heart disease in her mother; Hypertension in her father.  DRUG ALLERGIES:   Allergies   Allergen Reactions  . Tramadol   . Vicodin [Hydrocodone-Acetaminophen]    REVIEW OF SYSTEMS:   Review of Systems  Constitutional: Negative for fever, weight loss, malaise/fatigue and diaphoresis.  HENT: Negative for ear discharge, ear pain, hearing loss, nosebleeds, sore throat and tinnitus.   Eyes: Negative for blurred vision and pain.  Respiratory: Negative for cough, hemoptysis, shortness of breath and wheezing.   Cardiovascular: Positive for chest pain. Negative for palpitations, orthopnea and leg swelling.  Gastrointestinal: Positive for nausea. Negative for heartburn, vomiting, abdominal pain, diarrhea, constipation and blood in stool.  Genitourinary: Negative for dysuria, urgency and frequency.  Musculoskeletal: Positive for myalgias. Negative for back pain.  Skin: Negative for itching and rash.  Neurological: Positive for focal weakness and weakness. Negative for dizziness, tingling, tremors, seizures and headaches.  Psychiatric/Behavioral: Positive for substance abuse. Negative for depression. The patient is not nervous/anxious.     MEDICATIONS AT HOME:   Prior to Admission medications   Medication Sig Start Date End Date Taking? Authorizing Provider  Multiple Vitamin (MULTIVITAMIN) tablet Take 1 tablet by mouth daily.   Yes Historical Provider, MD      VITAL SIGNS:  Blood pressure 122/86, pulse 85, temperature 98.6 F (37 C), temperature source Oral, resp. rate 14, height 5\' 5"  (1.651 m), weight 145.151 kg (320 lb), SpO2 100 %. PHYSICAL EXAMINATION:  Physical Exam  Constitutional: She is oriented to person, place, and time and well-developed, well-nourished, and in no distress.  HENT:  Head: Normocephalic and atraumatic.  Eyes: Conjunctivae and EOM are normal. Pupils  are equal, round, and reactive to light.  Neck: Normal range of motion. Neck supple. No tracheal deviation present. No thyromegaly present.  Cardiovascular: Normal rate, regular rhythm and normal heart  sounds.   Pulmonary/Chest: Effort normal and breath sounds normal. No respiratory distress. She has no wheezes. She exhibits no tenderness.  Abdominal: Soft. Bowel sounds are normal. She exhibits no distension. There is no tenderness.  Musculoskeletal: Normal range of motion.  Neurological: She is alert and oriented to person, place, and time. No cranial nerve deficit.  Skin: Skin is warm and dry. No rash noted.  Psychiatric: Mood and affect normal.    LABORATORY PANEL:   CBC  Recent Labs Lab 03/30/16 2130  WBC 22.6*  HGB 14.1  HCT 41.4  PLT 327   ------------------------------------------------------------------------------------------------------------------  Chemistries   Recent Labs Lab 03/30/16 2130  NA 136  K 2.7*  CL 103  CO2 21*  GLUCOSE 108*  BUN 13  CREATININE 0.93  CALCIUM 8.9  AST 125*  ALT 42  ALKPHOS 81  BILITOT 1.4*   ------------------------------------------------------------------------------------------------------------------  Cardiac Enzymes  Recent Labs Lab 03/30/16 2130  TROPONINI 0.39*   ------------------------------------------------------------------------------------------------------------------  RADIOLOGY:  No results found. IMPRESSION AND PLAN:  40 year old female with severe rhabdomyolysis  * Severe rhabdomyolysis:  - Due to cocaine [place] [address] - start aggressive IV hydration - Check CK every 6 hours - Check lower extremity Doppler to rule out compartment syndrome - Likely etiology for her lower extremity weakness  * Chest pain: - Likely due to cocaine abuse - Cardiology recommends full dose anticoagulation along with aspirin due to some EKG changes - Minor ST elevation in lead 1 - We will start her on nitroglycerin - Serial troponins to r/o MI  * Severe hypokalemia - Replete and recheck - Check Magnesium  * Elevated troponin - Iikely due to supply demand ischemia from cocaine abuse - Cannot rule out  MI at this time, will do serial troponins - Monitor her on telemetry - Started 4 days heparin and aspirin 81 mg   All the records are reviewed and case discussed with ED provider. Management plans discussed with the patient, family and they are in agreement.  CODE STATUS: Full code  TOTAL TIME TAKING CARE OF THIS PATIENT: 45 minutes.    [facility], Holly Ponce M.D on 03/31/2016 at 12:29 AM  Between 7am to 6pm - Pager - [phone]  After 6pm go to [email or web address] - password [email or web address] [facility]  Office  [phone]  CC: Primary care physician; Gayland Curry, MD   Note: This dictation was prepared with Dragon dictation along with smaller phrase technology. Any transcriptional errors that result from this process are unintentional.

## 2016-03-31 NOTE — Progress Notes (Signed)
ANTICOAGULATION CONSULT NOTE - Initial Consult  Pharmacy Consult for heparin Indication: chest pain/ACS  Allergies  Allergen Reactions  . Tramadol   . Vicodin [Hydrocodone-Acetaminophen]     Patient Measurements: Height: 5\' 5"  (165.1 cm) Weight: (!) 315 lb (142.883 kg) IBW/kg (Calculated) : 57 Heparin Dosing Weight: 93.4 kg  Vital Signs: Temp: 98.4 F (36.9 C) (04/17 2011) Temp Source: Oral (04/17 2011) BP: 120/65 mmHg (04/17 2011) Pulse Rate: 77 (04/17 2011)  Labs:  Recent Labs  03/30/16 2130 03/31/16 0222 03/31/16 0443 03/31/16 1248 03/31/16 2051  HGB 14.1  --  12.1  --   --   HCT 41.4  --  35.8  --   --   PLT 327  --  299  --   --   APTT 27  --  35  --   --   LABPROT 15.1*  --  15.9*  --   --   INR 1.17  --  1.26  --   --   HEPARINUNFRC  --   --  0.11* 0.20* 0.28*  CREATININE 0.93  --  0.66  --   --   CKTOTAL 57846* 11774*  --   --   --   TROPONINI 0.39*  --   --   --   --     Estimated Creatinine Clearance: 137.6 mL/min (by C-G formula based on Cr of 0.66).   Medical History: Past Medical History  Diagnosis Date  . Sleep apnea     Medications:  Infusions:  . sodium chloride 150 mL/hr at 03/31/16 1449  . heparin      Assessment: 39 yof cc SUD. Recent cocaine use per patient, cardiology recommends heparin, troponin 0.39. Pharmacy consulted to dose heparin drip.   Goal of Therapy:  Heparin level 0.3-0.7 units/ml Monitor platelets by anticoagulation protocol: Yes   Plan:  Heparin level subtherapeutic at 0.20. Will order 1400 unit IV x 1 bolus and increase rate to 1500 units/hr. Will recheck level in 6 hours.  4/17 at 20:51  HL = 0.28.  Ordered a bolus of 1400 units.  Increased drip rate to 1650 units/hr.   Next HL ordered for 4/18 at 03:00.    [place] Clinical Pharmacist 03/31/2016,9:39 PM

## 2016-03-31 NOTE — Progress Notes (Signed)
ANTICOAGULATION CONSULT NOTE - Initial Consult  Pharmacy Consult for heparin Indication: chest pain/ACS  Allergies  Allergen Reactions  . Tramadol   . Vicodin [Hydrocodone-Acetaminophen]     Patient Measurements: Height: 5\' 5"  (165.1 cm) Weight: (!) 315 lb (142.883 kg) IBW/kg (Calculated) : 57 Heparin Dosing Weight: 93.4 kg  Vital Signs: Temp: 97.9 F (36.6 C) (04/17 1227) Temp Source: Oral (04/17 0800) BP: 119/82 mmHg (04/17 1227) Pulse Rate: 83 (04/17 1227)  Labs:  Recent Labs  03/30/16 2130 03/31/16 0222 03/31/16 0443 03/31/16 1248  HGB 14.1  --  12.1  --   HCT 41.4  --  35.8  --   PLT 327  --  299  --   APTT 27  --  35  --   LABPROT 15.1*  --  15.9*  --   INR 1.17  --  1.26  --   HEPARINUNFRC  --   --  0.11* 0.20*  CREATININE 0.93  --  0.66  --   CKTOTAL 96295* 11774*  --   --   TROPONINI 0.39*  --   --   --     Estimated Creatinine Clearance: 137.6 mL/min (by C-G formula based on Cr of 0.66).   Medical History: Past Medical History  Diagnosis Date  . Sleep apnea     Medications:  Infusions:  . sodium chloride 150 mL/hr at 03/31/16 1449  . heparin 1,300 Units/hr (03/31/16 1447)    Assessment: 39 yof cc SUD. Recent cocaine use per patient, cardiology recommends heparin, troponin 0.39. Pharmacy consulted to dose heparin drip.   Goal of Therapy:  Heparin level 0.3-0.7 units/ml Monitor platelets by anticoagulation protocol: Yes   Plan:  Heparin level subtherapeutic at 0.20. Will order 1400 unit IV x 1 bolus and increase rate to 1500 units/hr. Will recheck level in 6 hours.  Vira Blanco, Pharm.D., BCPS Clinical Pharmacist 03/31/2016,2:54 PM

## 2016-03-31 NOTE — Progress Notes (Signed)
Pt. Arrived to unit via stretcher. Staff transferred her from stretcher to bed. Heparin drip running at 27ml/hr. Second NS bolus given then maintenance NS started at 150. Tele called in, running NSR. Second Technical brewer, Therapist, sports. Skin assessment performed. No skin issues noted. Second verifying nurse Tiffany, RN. Pt. Alert and oriented. No c/o pain or acute distress noted. General room orientation given. How to use the ascom and call bell system explained. Bed exit activated R/T to numbness in pt. Legs. Family at bedside. Will continue to monitor pt.

## 2016-03-31 NOTE — Plan of Care (Signed)
Problem: Safety: Goal: Ability to remain free from injury will improve Outcome: Progressing Fall precautions in place     

## 2016-03-31 NOTE — Progress Notes (Signed)
Back from cardiovascular testing 

## 2016-04-01 ENCOUNTER — Inpatient Hospital Stay
Admit: 2016-04-01 | Discharge: 2016-04-01 | Disposition: A | Discharge status: Home / Self Care | Payer: MEDICAID | Attending: Cardiovascular Disease | Admitting: Cardiovascular Disease

## 2016-04-01 LAB — ECHOCARDIOGRAM COMPLETE
Height: 65 in
WEIGHTICAEL: 5023.56 [oz_av]

## 2016-04-01 LAB — RAPID HIV SCREEN (HIV 1/2 AB+AG)
HIV 1/2 Antibodies: NONREACTIVE
HIV-1 P24 Antigen - HIV24: NONREACTIVE

## 2016-04-01 LAB — BASIC METABOLIC PANEL
Anion gap: 4 — ABNORMAL LOW (ref 5–15)
BUN: 10 mg/dL (ref 6–20)
CHLORIDE: 107 mmol/L (ref 101–111)
CO2: 25 mmol/L (ref 22–32)
CREATININE: 0.6 mg/dL (ref 0.44–1.00)
Calcium: 7.8 mg/dL — ABNORMAL LOW (ref 8.9–10.3)
GFR calc Af Amer: >60 mL/min (ref >60)
GFR calc non Af Amer: >60 mL/min (ref >60)
GLUCOSE: 112 mg/dL — AB (ref 65–99)
Potassium: 3.4 mmol/L — ABNORMAL LOW (ref 3.5–5.1)
SODIUM: 136 mmol/L (ref 135–145)

## 2016-04-01 LAB — CHLAMYDIA/NGC RT PCR (ARMC ONLY)
CHLAMYDIA TR: NOT DETECTED
N gonorrhoeae: NOT DETECTED

## 2016-04-01 LAB — CBC
HCT: 36 % (ref 35.0–47.0)
Hemoglobin: 11.9 g/dL — ABNORMAL LOW (ref 12.0–16.0)
MCH: 29.1 pg (ref 26.0–34.0)
MCHC: 33.1 g/dL (ref 32.0–36.0)
MCV: 87.8 fL (ref 80.0–100.0)
PLATELETS: 268 10*3/uL (ref 150–440)
RBC: 4.1 MIL/uL (ref 3.80–5.20)
RDW: 14.1 % (ref 11.5–14.5)
WBC: 12 10*3/uL — AB (ref 3.6–11.0)

## 2016-04-01 LAB — GLUCOSE, CAPILLARY: GLUCOSE-CAPILLARY: 103 mg/dL — AB (ref 65–99)

## 2016-04-01 LAB — CK: Total CK: 8428 U/L — ABNORMAL HIGH (ref 38–234)

## 2016-04-01 LAB — HEPARIN LEVEL (UNFRACTIONATED)
Heparin Unfractionated: 0.43 IU/mL (ref 0.30–0.70)
Heparin Unfractionated: 0.36 IU/mL (ref 0.30–0.70)

## 2016-04-01 MED ORDER — POTASSIUM CHLORIDE CRYS ER 20 MEQ PO TBCR
40.0000 meq | EXTENDED_RELEASE_TABLET | Freq: Once | ORAL | Status: AC
  Administered 2016-04-01: 40 meq via ORAL
  Filled 2016-04-01: qty 2

## 2016-04-01 NOTE — Evaluation (Signed)
Physical Therapy Evaluation Patient Details Name: Holly Ponce MRN: [id number] DOB: December 02, 1976 Today's Date: 04/01/2016   History of Present Illness  40 yo F presented to the ED for numb legs and chest pain thought to have overdosed on cocaine. She was found to have rhabdomyolysis. PMH includes PID, GERD and hydrosalpinx.  Clinical Impression  Pt demonstrated generalized weakness and difficulty walking, limited by muscle soreness/pain. She requires supervision for transfers and min guard for ambulation up to 225 ft with FWW. Navigated 3 steps with rails and min guard. Cues provided for proper sequence and safety. Pt is near her baseline LOF and is only limited by her LE muscular pain/discomfort. She doesn't present a need for continued skilled PT services. Pt will be able to ambulate with staff in the hallway and continue HEP to progress towards PLOF. PT recommended pt use a FWW after discharge from hospital if her LE muscle pain/discomfort continues. Pt has no further skilled PT needs at this time and current orders will be completed. If a change in status occurs or needs arise, new PT orders will be needed.     Follow Up Recommendations No PT follow up    Equipment Recommendations  Rolling walker with 5" wheels (If pt continues to have pain in LEs by discharge)    Recommendations for Other Services       Precautions / Restrictions Precautions Precautions: Fall Restrictions Weight Bearing Restrictions: No      Mobility  Bed Mobility Overal bed mobility: Independent                Transfers Overall transfer level: Needs assistance Equipment used: None Transfers: Sit to/from Stand;Stand Pivot Transfers Sit to Stand: Supervision Stand pivot transfers: Supervision       General transfer comment: cues for hand placement and anterior weight shifting to increase ease of STS  Ambulation/Gait Ambulation/Gait assistance: Min guard Ambulation Distance (Feet): 225  Feet Assistive device: Rolling walker (2 wheeled) Gait Pattern/deviations: Decreased stride length;Decreased stance time - right;Trunk flexed Gait velocity: reduced Gait velocity interpretation: Below normal speed for age/gender General Gait Details: Demonstrated generally steady gait with no LOB. Difficulty due to sore LE muscles, but improved with further ambulation. Cues for proper use of FWW. Pt educated that she may need to use a FWW depending on continuation of her thigh soreness/pain when discharged from hospital.  Stairs Stairs: Yes Stairs assistance: Min guard Stair Management: Two rails Number of Stairs: 3 General stair comments: cues for sequence with strong/up and weak/down  Wheelchair Mobility    Modified Rankin (Stroke Patients Only)       Balance Overall balance assessment: Needs assistance Sitting-balance support: No upper extremity supported Sitting balance-Leahy Scale: Good     Standing balance support: No upper extremity supported Standing balance-Leahy Scale: Fair Standing balance comment: maintains balance without UE support                             Pertinent Vitals/Pain Pain Assessment: 0-10 Pain Score: 5  Pain Location: R thigh Pain Descriptors / Indicators: Aching;Burning Pain Intervention(s): Limited activity within patient's tolerance;Monitored during session    Home Living Family/patient expects to be discharged to:: Private residence Living Arrangements: Spouse/significant other Available Help at Discharge: Family Type of Home: House Home Access: Stairs to enter Entrance Stairs-Rails: Can reach both Entrance Stairs-Number of Steps: 4 Home Layout: One level Home Equipment: None Additional Comments: Pt says she can borrow FWW from  family or purchase one if needed.    Prior Function Level of Independence: Independent         Comments: I with ADLs and community ambulation, drives, works     Journalist, newspaper         Extremity/Trunk Assessment   Upper Extremity Assessment: Overall WFL for tasks assessed           Lower Extremity Assessment: Generalized weakness;RLE deficits/detail RLE Deficits / Details: grossly 4-/5       Communication   Communication: No difficulties  Cognition Arousal/Alertness: Awake/alert Behavior During Therapy: WFL for tasks assessed/performed Overall Cognitive Status: Within Functional Limits for tasks assessed                      General Comments      Exercises Other Exercises Other Exercises: Ascended/Descended 3 steps with min guard and cues for sequence with strong/up and weak/down for improved stability and safety. She demonstrated understanding. Other Exercises: Pt education and practice of LE exercises for increasing strength and reducing stiffness including: AAROM LAQs, supine AAROM heel slides and SLR, hip abd slides, ankle pumps, QS and GS. Pt performed x5 with cues for technique. Showed pt how to use a towel for AAROM. Instructed pt to perform x10 reps 2 to 3 times per day as tolerated. Pt verbalized and demonstrated understanding of education provided.      Assessment/Plan    PT Assessment Patent does not need any further PT services  PT Diagnosis Difficulty walking;Generalized weakness;Acute pain   PT Problem List    PT Treatment Interventions     PT Goals (Current goals can be found in the Care Plan section) Acute Rehab PT Goals Patient Stated Goal: "to get off this walker" PT Goal Formulation: With patient Time For Goal Achievement: 04/15/16 Potential to Achieve Goals: Good    Frequency     Barriers to discharge        Co-evaluation               End of Session Equipment Utilized During Treatment: Gait belt Activity Tolerance: Patient tolerated treatment well;Patient limited by pain Patient left: in bed;with call bell/phone within reach;with bed alarm set;with family/visitor present Nurse Communication: Mobility  status         Time: [id number] PT Time Calculation (min) (ACUTE ONLY): 29 min   Charges:   PT Evaluation $PT Eval Moderate Complexity: 1 Procedure PT Treatments $Therapeutic Activity: 8-22 mins   PT G Codes:        Neoma Laming, PT, DPT  04/01/2016, 2:54 PM [phone]

## 2016-04-01 NOTE — Progress Notes (Signed)
SUBJECTIVE: No chest pain but does have leg swelling   Filed Vitals:   03/31/16 1227 03/31/16 2011 04/01/16 0410 04/01/16 0800  BP: 119/82 120/65 134/78 130/86  Pulse: 83 77 71 77  Temp: 97.9 F (36.6 C) 98.4 F (36.9 C) 97.8 F (36.6 C) 97.7 F (36.5 C)  TempSrc:  Oral Oral Oral  Resp: 20 16 16 17   Height:      Weight:   313 lb 15.6 oz (142.417 kg)   SpO2: 95% 98% 96% 98%    Intake/Output Summary (Last 24 hours) at 04/01/16 0903 Last data filed at 04/01/16 0805  Gross per 24 hour  Intake 2316.9 ml  Output   1050 ml  Net 1266.9 ml    LABS: Basic Metabolic Panel:  Recent Labs  03/31/16 0443 04/01/16 0341  NA 137 136  K 3.0* 3.4*  CL 108 107  CO2 20* 25  GLUCOSE 99 112*  BUN 12 10  CREATININE 0.66 0.60  CALCIUM 7.8* 7.8*  MG 2.0  --    Liver Function Tests:  Recent Labs  03/30/16 2130 03/31/16 0443  AST 125* 136*  ALT 42 41  ALKPHOS 81 68  BILITOT 1.4* 1.0  PROT 8.2* 6.8  ALBUMIN 4.5 3.8   No results for input(s): LIPASE, AMYLASE in the last 72 hours. CBC:  Recent Labs  03/31/16 0443 04/01/16 0341  WBC 19.0* 12.0*  HGB 12.1 11.9*  HCT 35.8 36.0  MCV 85.2 87.8  PLT 299 268   Cardiac Enzymes:  Recent Labs  03/30/16 2130 03/31/16 0222 04/01/16 0341  CKTOTAL 60454* 11774* 8428*  TROPONINI 0.39*  --   --    BNP: Invalid input(s): POCBNP D-Dimer: No results for input(s): DDIMER in the last 72 hours. Hemoglobin A1C: No results for input(s): HGBA1C in the last 72 hours. Fasting Lipid Panel: No results for input(s): CHOL, HDL, LDLCALC, TRIG, CHOLHDL, LDLDIRECT in the last 72 hours. Thyroid Function Tests: No results for input(s): TSH, T4TOTAL, T3FREE, THYROIDAB in the last 72 hours.  Invalid input(s): FREET3 Anemia Panel: No results for input(s): VITAMINB12, FOLATE, FERRITIN, TIBC, IRON, RETICCTPCT in the last 72 hours.   PHYSICAL EXAM General: Well developed, well nourished, in no acute distress HEENT:  Normocephalic and  atramatic Neck:  No JVD.  Lungs: Clear bilaterally to auscultation and percussion. Heart: HRRR . Normal S1 and S2 without gallops or murmurs.  Abdomen: Bowel sounds are positive, abdomen soft and non-tender  Msk:  Back normal, normal gait. Normal strength and tone for age. Extremities: No clubbing, cyanosis or edema.   Neuro: Alert and oriented X 3. Psych:  Good affect, responds appropriately  TELEMETRY:Sinus rhythm  ASSESSMENT AND PLAN: Atypical chest pain secondary to cocaine use. No acute EKG changes. Patient has no further chest pain and most of the symptoms are related to rhabdomyolysis with leg pain. After echocardiogram patient may go home with follow-up in the office on Monday at 2 PM. Since patient is not having chest pain will sign off.  Active Problems:   Rhabdomyolysis    Dionisio David, MD, [facility] 04/01/2016 9:03 AM

## 2016-04-01 NOTE — Progress Notes (Signed)
Zofran 4mg iv given for complaints of nausea, will monitor. 

## 2016-04-01 NOTE — Progress Notes (Addendum)
ANTICOAGULATION CONSULT NOTE - Initial Consult  Pharmacy Consult for heparin Indication: chest pain/ACS  Allergies  Allergen Reactions  . Tramadol   . Vicodin [Hydrocodone-Acetaminophen]     Patient Measurements: Height: 5\' 5"  (165.1 cm) Weight: (!) 313 lb 15.6 oz (142.417 kg) IBW/kg (Calculated) : 57 Heparin Dosing Weight: 93.4 kg  Vital Signs: Temp: 97.8 F (36.6 C) (04/18 0410) Temp Source: Oral (04/18 0410) BP: 134/78 mmHg (04/18 0410) Pulse Rate: 71 (04/18 0410)  Labs:  Recent Labs  03/30/16 2130 03/31/16 0222  03/31/16 0443 03/31/16 1248 03/31/16 2051 04/01/16 0341  HGB 14.1  --   --  12.1  --   --  11.9*  HCT 41.4  --   --  35.8  --   --  36.0  PLT 327  --   --  299  --   --  268  APTT 27  --   --  35  --   --   --   LABPROT 15.1*  --   --  15.9*  --   --   --   INR 1.17  --   --  1.26  --   --   --   HEPARINUNFRC  --   --   < > 0.11* 0.20* 0.28* 0.43  CREATININE 0.93  --   --  0.66  --   --   --   CKTOTAL 95284* 11774*  --   --   --   --   --   TROPONINI 0.39*  --   --   --   --   --   --   < > = values in this interval not displayed.  Estimated Creatinine Clearance: 137.3 mL/min (by C-G formula based on Cr of 0.66).   Medical History: Past Medical History  Diagnosis Date  . Sleep apnea     Medications:  Infusions:  . sodium chloride 150 mL/hr at 03/31/16 1449  . heparin 1,650 Units/hr (03/31/16 2233)    Assessment: 36 yof cc SUD. Recent cocaine use per patient, cardiology recommends heparin, troponin 0.39. Pharmacy consulted to dose heparin drip.   Goal of Therapy:  Heparin level 0.3-0.7 units/ml Monitor platelets by anticoagulation protocol: Yes   Plan:  Heparin level subtherapeutic at 0.20. Will order 1400 unit IV x 1 bolus and increase rate to 1500 units/hr. Will recheck level in 6 hours.  4/17 at 20:51  HL = 0.28.  Ordered a bolus of 1400 units.  Increased drip rate to 1650 units/hr.   Next HL ordered for 4/18 at 03:00.    04/18  03:30 heparin level 0.43. Continue current regimen. Recheck in 6 hours to confirm.  Eloise Harman, RPh Clinical Pharmacist 04/01/2016,4:53 AM

## 2016-04-01 NOTE — Progress Notes (Signed)
*  PRELIMINARY RESULTS* Echocardiogram 2D Echocardiogram has been performed.  Laqueta Jean Hege 04/01/2016, 9:48 AM

## 2016-04-01 NOTE — Plan of Care (Signed)
Problem: Safety: Goal: Ability to remain free from injury will improve Outcome: Progressing Fall precautions in place     

## 2016-04-01 NOTE — Progress Notes (Signed)
ANTICOAGULATION CONSULT NOTE - Initial Consult  Pharmacy Consult for heparin Indication: chest pain/ACS  Allergies  Allergen Reactions  . Tramadol   . Vicodin [Hydrocodone-Acetaminophen]     Patient Measurements: Height: 5\' 5"  (165.1 cm) Weight: (!) 313 lb 15.6 oz (142.417 kg) IBW/kg (Calculated) : 57 Heparin Dosing Weight: 93.4 kg  Vital Signs: Temp: 98.3 F (36.8 C) (04/18 1159) Temp Source: Oral (04/18 1159) BP: 134/78 mmHg (04/18 1159) Pulse Rate: 75 (04/18 1159)  Labs:  Recent Labs  03/30/16 2130 03/31/16 0222 03/31/16 0443  03/31/16 2051 04/01/16 0341 04/01/16 1008  HGB 14.1  --  12.1  --   --  11.9*  --   HCT 41.4  --  35.8  --   --  36.0  --   PLT 327  --  299  --   --  268  --   APTT 27  --  35  --   --   --   --   LABPROT 15.1*  --  15.9*  --   --   --   --   INR 1.17  --  1.26  --   --   --   --   HEPARINUNFRC  --   --  0.11*  < > 0.28* 0.43 0.36  CREATININE 0.93  --  0.66  --   --  0.60  --   CKTOTAL 29562* 11774*  --   --   --  8428*  --   TROPONINI 0.39*  --   --   --   --   --   --   < > = values in this interval not displayed.  Estimated Creatinine Clearance: 137.3 mL/min (by C-G formula based on Cr of 0.6).   Medical History: Past Medical History  Diagnosis Date  . Sleep apnea     Medications:  Infusions:  . sodium chloride 150 mL/hr at 04/01/16 1043  . heparin 1,650 Units/hr (04/01/16 0809)    Assessment: 33 yof cc SUD. Recent cocaine use per patient, cardiology recommends heparin, troponin 0.39. Pharmacy consulted to dose heparin drip.   Goal of Therapy:  Heparin level 0.3-0.7 units/ml Monitor platelets by anticoagulation protocol: Yes   Plan:  Heparin level subtherapeutic at 0.20. Will order 1400 unit IV x 1 bolus and increase rate to 1500 units/hr. Will recheck level in 6 hours.  4/17 at 20:51  HL = 0.28.  Ordered a bolus of 1400 units.  Increased drip rate to 1650 units/hr.   Next HL ordered for 4/18 at 03:00.    04/18  03:30 heparin level 0.43. Continue current regimen. Recheck in 6 hours to confirm.  04/18 10:00 heparin level 0.36. Continue current regimen. Recheck with am labs.  Paulina Fusi, PharmD, BCPS 04/01/2016 12:05 PM

## 2016-04-01 NOTE — Progress Notes (Signed)
[facility] [place] NAME: Holly Ponce    MR#:  [id number]  DATE OF BIRTH:  [birth year]  SUBJECTIVE:  CHIEF COMPLAINT:   Chief Complaint  Patient presents with  . Drug Problem   Better muscle aching and can move legs. REVIEW OF SYSTEMS:  CONSTITUTIONAL: No fever, has weakness.  EYES: No blurred or double vision.  EARS, NOSE, AND THROAT: No tinnitus or ear pain.  RESPIRATORY: No cough, shortness of breath, wheezing or hemoptysis.  CARDIOVASCULAR: No chest pain, no orthopnea, edema.  GASTROINTESTINAL: No nausea, vomiting, diarrhea or abdominal pain.  GENITOURINARY: No dysuria, hematuria.  ENDOCRINE: No polyuria, nocturia,  HEMATOLOGY: No anemia, easy bruising or bleeding SKIN: No rash or lesion. MUSCULOSKELETAL: No joint pain or arthritis. But has muscle aching.  NEUROLOGIC: No tingling, numbness, weakness.  PSYCHIATRY: No anxiety or depression.   DRUG ALLERGIES:   Allergies  Allergen Reactions  . Tramadol   . Vicodin [Hydrocodone-Acetaminophen]     VITALS:  Blood pressure 134/78, pulse 80, temperature 98.3 F (36.8 C), temperature source Oral, resp. rate 18, height 5\' 5"  (1.651 m), weight 142.417 kg (313 lb 15.6 oz), SpO2 99 %.  PHYSICAL EXAMINATION:  GENERAL:  40 y.o.-year-old patient lying in the bed with no acute distress. Morbid obese. EYES: Pupils equal, round, reactive to light and accommodation. No scleral icterus. Extraocular muscles intact.  HEENT: Head atraumatic, normocephalic. Oropharynx and nasopharynx clear.  NECK:  Supple, no jugular venous distention. No thyroid enlargement, no tenderness.  LUNGS: Normal breath sounds bilaterally, no wheezing, rales,rhonchi or crepitation. No use of accessory muscles of respiration.  CARDIOVASCULAR: S1, S2 normal. No murmurs, rubs, or gallops.  ABDOMEN: Soft, nontender, nondistended. Bowel sounds present. No organomegaly or mass.  EXTREMITIES: No pedal edema, cyanosis,  or clubbing.  NEUROLOGIC: Cranial nerves II through XII are intact. Muscle strength 4/5 in all extremities. Sensation intact. Gait not checked.  PSYCHIATRIC: The patient is alert and oriented x 3.  SKIN: No obvious rash, lesion, or ulcer.    LABORATORY PANEL:   CBC  Recent Labs Lab 04/01/16 0341  WBC 12.0*  HGB 11.9*  HCT 36.0  PLT 268   ------------------------------------------------------------------------------------------------------------------  Chemistries   Recent Labs Lab 03/31/16 0443 04/01/16 0341  NA 137 136  K 3.0* 3.4*  CL 108 107  CO2 20* 25  GLUCOSE 99 112*  BUN 12 10  CREATININE 0.66 0.60  CALCIUM 7.8* 7.8*  MG 2.0  --   AST 136*  --   ALT 41  --   ALKPHOS 68  --   BILITOT 1.0  --    ------------------------------------------------------------------------------------------------------------------  Cardiac Enzymes  Recent Labs Lab 03/30/16 2130  TROPONINI 0.39*   ------------------------------------------------------------------------------------------------------------------  RADIOLOGY:  US Venous Img Lower Bilateral  03/31/2016  CLINICAL DATA:  Bilateral lower extremity pain and edema. History of drug abuse. Evaluate for DVT. EXAM: BILATERAL LOWER EXTREMITY VENOUS DOPPLER ULTRASOUND TECHNIQUE: Gray-scale sonography with graded compression, as well as color Doppler and duplex ultrasound were performed to evaluate the lower extremity deep venous systems from the level of the common femoral vein and including the common femoral, femoral, profunda femoral, popliteal and calf veins including the posterior tibial, peroneal and gastrocnemius veins when visible. The superficial great saphenous vein was also interrogated. Spectral Doppler was utilized to evaluate flow at rest and with distal augmentation maneuvers in the common femoral, femoral and popliteal veins. COMPARISON:  None. FINDINGS: RIGHT LOWER EXTREMITY Common Femoral Vein: No evidence of  thrombus. Normal compressibility, respiratory phasicity and response to augmentation. Saphenofemoral Junction: No evidence of thrombus. Normal compressibility and flow on color Doppler imaging. Profunda Femoral Vein: No evidence of thrombus. Normal compressibility and flow on color Doppler imaging. Femoral Vein: No evidence of thrombus. Normal compressibility, respiratory phasicity and response to augmentation. Popliteal Vein: No evidence of thrombus. Normal compressibility, respiratory phasicity and response to augmentation. Calf Veins: No evidence of thrombus. Normal compressibility and flow on color Doppler imaging. Superficial Great Saphenous Vein: No evidence of thrombus. Normal compressibility and flow on color Doppler imaging. Venous Reflux:  None. Other Findings:  None. LEFT LOWER EXTREMITY Common Femoral Vein: No evidence of thrombus. Normal compressibility, respiratory phasicity and response to augmentation. Saphenofemoral Junction: No evidence of thrombus. Normal compressibility and flow on color Doppler imaging. Profunda Femoral Vein: No evidence of thrombus. Normal compressibility and flow on color Doppler imaging. Femoral Vein: No evidence of thrombus. Normal compressibility, respiratory phasicity and response to augmentation. Popliteal Vein: No evidence of thrombus. Normal compressibility, respiratory phasicity and response to augmentation. Calf Veins: No evidence of thrombus. Normal compressibility and flow on color Doppler imaging. Superficial Great Saphenous Vein: No evidence of thrombus. Normal compressibility and flow on color Doppler imaging. Venous Reflux:  None. Other Findings:  None. IMPRESSION: No evidence of DVT within either lower extremity. Electronically Signed   By: Sandi Mariscal M.D.   On: 03/31/2016 12:30   Dg Chest Portable 1 View  03/31/2016  CLINICAL DATA:  Initial evaluation for acute chest pain. EXAM: PORTABLE CHEST 1 VIEW COMPARISON:  None. FINDINGS: Cardiac and mediastinal  silhouettes are within normal limits. Elevation the right hemidiaphragm. Associated mild right basilar atelectasis. No consolidative airspace disease. No pulmonary edema or pleural effusion. No pneumothorax. No acute osseus abnormality. IMPRESSION: Elevation of the right hemidiaphragm with associated right basilar atelectasis/ bronchovascular crowding. No other active cardiopulmonary disease. Electronically Signed   By: Jeannine Boga M.D.   On: 03/31/2016 00:31    EKG:   Orders placed or performed during the hospital encounter of 03/30/16  . EKG 12-Lead  . EKG 12-Lead    ASSESSMENT AND PLAN:   40 year old female with severe rhabdomyolysis  * Severe rhabdomyolysis:  - Due to cocaine abuse - [place] to 11774 and down to 8428 today. continue aggressive IV hydration, f/u CK and BMP.  negative lower extremity Doppler.  * Atypical  Chest pain, due to cocaine abuse She has been treated with heparin drip and on nitroglycerin prn. No acute EKG changes. Discontinue heparin drip. Echo: EF 65%. F/u Dr. Humphrey Rolls at 2 pm this Monday.  Elevated troponin, due to rhabdomyolysis  F/u echo bubble study per Dr. Argie Ramming.  * Hypokalemia - Repleted KCl, f/u BMP. Normal Magnesium.  * Leukocytosis. Improving. Possible due to reaction to cocaine. Improved.  All the records are reviewed and case discussed with Care Management/Social Workerr. Management plans discussed with the patient, family and they are in agreement.  CODE STATUS: full code.  TOTAL TIME TAKING CARE OF THIS PATIENT: 35 minutes.  Greater than 50% time was spent on coordination of care and face-to-face counseling.  POSSIBLE D/C IN 2 DAYS, DEPENDING ON CLINICAL CONDITION.   Demetrios Loll M.D on 04/01/2016 at 4:22 PM  Between 7am to 6pm - Pager - [phone]  After 6pm go to [email or web address] - password [email or web address] [facility]  Office  [phone]  CC: Primary care physician; Gayland Curry, MD

## 2016-04-01 NOTE — Progress Notes (Signed)
Pt sitting in chair, states legs feel little better, rt leg mainly is the most uncomfortable.  Denies any numbness in her legs this am.

## 2016-04-02 LAB — BASIC METABOLIC PANEL
Anion gap: 6 (ref 5–15)
BUN: 6 mg/dL (ref 6–20)
CHLORIDE: 106 mmol/L (ref 101–111)
CO2: 28 mmol/L (ref 22–32)
CREATININE: 0.54 mg/dL (ref 0.44–1.00)
Calcium: 8.3 mg/dL — ABNORMAL LOW (ref 8.9–10.3)
GFR calc Af Amer: >60 mL/min (ref >60)
GLUCOSE: 105 mg/dL — AB (ref 65–99)
Potassium: 3.8 mmol/L (ref 3.5–5.1)
SODIUM: 140 mmol/L (ref 135–145)

## 2016-04-02 LAB — HEPATITIS PANEL, ACUTE
HEP B C IGM: NEGATIVE
HEP B S AG: NEGATIVE
Hep A IgM: NEGATIVE

## 2016-04-02 LAB — CBC
HEMATOCRIT: 36.1 % (ref 35.0–47.0)
HEMOGLOBIN: 12.2 g/dL (ref 12.0–16.0)
MCH: 29.2 pg (ref 26.0–34.0)
MCHC: 33.8 g/dL (ref 32.0–36.0)
MCV: 86.4 fL (ref 80.0–100.0)
Platelets: 278 10*3/uL (ref 150–440)
RBC: 4.18 MIL/uL (ref 3.80–5.20)
RDW: 14.2 % (ref 11.5–14.5)
WBC: 10.4 10*3/uL (ref 3.6–11.0)

## 2016-04-02 LAB — RAPID HIV SCREEN (HIV 1/2 AB+AG)
HIV 1/2 Antibodies: NONREACTIVE
HIV-1 P24 Antigen - HIV24: NONREACTIVE

## 2016-04-02 LAB — HEPATITIS C ANTIBODY

## 2016-04-02 LAB — HEPATITIS B SURFACE ANTIBODY, QUANTITATIVE: HEPATITIS B-POST: 109.9 m[IU]/mL

## 2016-04-02 LAB — CK: CK TOTAL: 6064 U/L — AB (ref 38–234)

## 2016-04-02 LAB — HEPATITIS B CORE ANTIBODY, TOTAL: HEP B C TOTAL AB: NEGATIVE

## 2016-04-02 LAB — RPR: RPR Ser Ql: NONREACTIVE

## 2016-04-02 LAB — GLUCOSE, CAPILLARY: Glucose-Capillary: 100 mg/dL — ABNORMAL HIGH (ref 65–99)

## 2016-04-02 MED ORDER — SENNOSIDES-DOCUSATE SODIUM 8.6-50 MG PO TABS
1.0000 | ORAL_TABLET | Freq: Every evening | ORAL | Status: DC | PRN
  Administered 2016-04-02 – 2016-04-03 (×2): 1 via ORAL
  Filled 2016-04-02 (×2): qty 1

## 2016-04-02 MED ORDER — ACETAMINOPHEN 325 MG PO TABS
650.0000 mg | ORAL_TABLET | Freq: Four times a day (QID) | ORAL | Status: DC | PRN
  Administered 2016-04-02 – 2016-04-03 (×2): 650 mg via ORAL
  Filled 2016-04-02 (×2): qty 2

## 2016-04-02 NOTE — Progress Notes (Signed)
Patient ID: Holly Ponce, female   DOB: 1976/08/07, 40 y.o.   MRN: [id number] [facility] PROGRESS NOTE  Holly Ponce [id number] DOB: 06-25-1976 DOA: 03/30/2016 PCP: Gayland Curry, MD  HPI/Subjective: Patient still feels weak in the legs. When she came in her legs were severely tender and non-and you can barely touch them. Now the pain is much improved but still feels weak.  Objective: Filed Vitals:   04/02/16 0851 04/02/16 1149  BP: 132/78 146/89  Pulse: 82 73  Temp: 98 F (36.7 C) 98.9 F (37.2 C)  Resp: 18 18    Filed Weights   03/31/16 0455 04/01/16 0410 04/02/16 0500  Weight: 142.883 kg (315 lb) 142.417 kg (313 lb 15.6 oz) 147.782 kg (325 lb 12.8 oz)    ROS: Review of Systems  Constitutional: Negative for fever and chills.  Eyes: Negative for blurred vision.  Respiratory: Negative for cough and shortness of breath.   Cardiovascular: Negative for chest pain.  Gastrointestinal: Negative for nausea, vomiting, abdominal pain, diarrhea and constipation.  Genitourinary: Negative for dysuria.  Musculoskeletal: Negative for joint pain.  Neurological: Positive for weakness. Negative for dizziness and headaches.   Exam: Physical Exam  Constitutional: She is oriented to person, place, and time.  HENT:  Nose: No mucosal edema.  Mouth/Throat: No oropharyngeal exudate or posterior oropharyngeal edema.  Eyes: Conjunctivae, EOM and lids are normal. Pupils are equal, round, and reactive to light.  Neck: No JVD present. Carotid bruit is not present. No edema present. No thyroid mass and no thyromegaly present.  Cardiovascular: S1 normal and S2 normal.  Exam reveals no gallop.   No murmur heard. Pulses:      Dorsalis pedis pulses are 2+ on the right side, and 2+ on the left side.  Respiratory: No respiratory distress. She has no wheezes. She has no rhonchi. She has no rales.  GI: Soft. Bowel sounds are normal. There is no tenderness.  Musculoskeletal:       Right  ankle: She exhibits swelling.       Left ankle: She exhibits swelling.  Lymphadenopathy:    She has no cervical adenopathy.  Neurological: She is alert and oriented to person, place, and time. No cranial nerve deficit.  Skin: Skin is warm. No rash noted. Nails show no clubbing.  Psychiatric: She has a normal mood and affect.      Data Reviewed: Basic Metabolic Panel:  Recent Labs Lab 03/30/16 2130 03/31/16 0443 04/01/16 0341 04/02/16 0507  NA 136 137 136 140  K 2.7* 3.0* 3.4* 3.8  CL 103 108 107 106  CO2 21* 20* 25 28  GLUCOSE 108* 99 112* 105*  BUN 13 12 10 6   CREATININE 0.93 0.66 0.60 0.54  CALCIUM 8.9 7.8* 7.8* 8.3*  MG  --  2.0  --   --    Liver Function Tests:  Recent Labs Lab 03/30/16 2130 03/31/16 0443  AST 125* 136*  ALT 42 41  ALKPHOS 81 68  BILITOT 1.4* 1.0  PROT 8.2* 6.8  ALBUMIN 4.5 3.8   CBC:  Recent Labs Lab 03/30/16 2130 03/31/16 0443 04/01/16 0341 04/02/16 0507  WBC 22.6* 19.0* 12.0* 10.4  HGB 14.1 12.1 11.9* 12.2  HCT 41.4 35.8 36.0 36.1  MCV 86.0 85.2 87.8 86.4  PLT 327 299 268 278   Cardiac Enzymes:  Recent Labs Lab 03/30/16 2130 03/31/16 0222 04/01/16 0341 04/02/16 0507  CKTOTAL 16109* 11774* 8428* 6064*  TROPONINI 0.39*  --   --   --  CBG:  Recent Labs Lab 03/31/16 0742 04/01/16 0740 04/02/16 0739  GLUCAP 93 103* 100*    Recent Results (from the past 240 hour(s))  Chlamydia/NGC rt PCR ([facility] only)     Status: None   Collection Time: 04/01/16  8:29 AM  Result Value Ref Range Status   Specimen source GC/Chlam URINE, RANDOM  Final   Chlamydia Tr NOT DETECTED NOT DETECTED Final   N gonorrhoeae NOT DETECTED NOT DETECTED Final    Comment: (NOTE) 100  This methodology has not been evaluated in pregnant women or in 200  patients with a history of hysterectomy. 300 400  This methodology will not be performed on patients less than 42  years of age.      Scheduled Meds: . aspirin EC  81 mg Oral Daily  .  multivitamin with minerals  1 tablet Oral Daily  . sodium chloride flush  3 mL Intravenous Q12H   Continuous Infusions: . sodium chloride 150 mL/hr at 04/02/16 1308    Assessment/Plan:  1. Severe rhabdomyolysis with bilateral lower extremity leg weakness. CPK trending down to 6064 today. Continue to see trend of CPKs on a daily basis and continue IV fluid hydration. This is likely secondary to crack cocaine use. 2. Atypical chest pain secondary to crack cocaine use. Aspirin only. 3. Elevated troponin secondary to rhabdomyolysis 4. Hypokalemia replaced 5. Leukocytosis normalized  Code Status:     Code Status Orders        Start     Ordered   03/31/16 0136  Full code   Continuous     03/31/16 0138    Code Status History    Date Active Date Inactive Code Status Order ID Comments User Context   This patient has a current code status but no historical code status.     Disposition Plan: Home once CPK trending better  Consultants:  Cardiology  Time spent: 22 minutes  [place], [place]

## 2016-04-03 LAB — TSH: TSH: 1.99 u[IU]/mL (ref 0.350–4.500)

## 2016-04-03 LAB — CK: CK TOTAL: 4337 U/L — AB (ref 38–234)

## 2016-04-03 LAB — GLUCOSE, CAPILLARY: GLUCOSE-CAPILLARY: 82 mg/dL (ref 65–99)

## 2016-04-03 MED ORDER — ENOXAPARIN SODIUM 40 MG/0.4ML ~~LOC~~ SOLN: 40.0000 mg | Freq: Two times a day (BID) | SUBCUTANEOUS | Status: DC

## 2016-04-03 MED ORDER — ENOXAPARIN SODIUM 40 MG/0.4ML ~~LOC~~ SOLN
40.0000 mg | Freq: Two times a day (BID) | SUBCUTANEOUS | Status: DC
  Filled 2016-04-03: qty 0.4

## 2016-04-03 NOTE — Care Management (Signed)
Self pay patient admitted with cocaine overdose. CK trending down. IVF. Will monitor for discharge needs.

## 2016-04-03 NOTE — Progress Notes (Signed)
Anticoagulation monitoring(Lovenox):  40 yo  ordered Lovenox 40 mg Q24h  Filed Weights   04/01/16 0410 04/02/16 0500 04/03/16 0353  Weight: 313 lb 15.6 oz (142.417 kg) 325 lb 12.8 oz (147.782 kg) 322 lb 1.6 oz (146.104 kg)   BMI 53.4  Lab Results  Component Value Date   CREATININE 0.54 04/02/2016   CREATININE 0.60 04/01/2016   CREATININE 0.66 03/31/2016   Estimated Creatinine Clearance: 139.4 mL/min (by C-G formula based on Cr of 0.54). Hemoglobin & Hematocrit     Component Value Date/Time   HGB 12.2 04/02/2016 0507   HCT 36.1 04/02/2016 0507     Per Protocol for Patient with estCrcl > 30 ml/min and BMI > 40, will transition to Lovenox 40 mg Q12h.

## 2016-04-03 NOTE — Progress Notes (Signed)
Patient has rested comfortably today. Complained of leg pain once after taking a shower and moving around quite a bit in the room today. Given tylenol with some relief. No other complaints. If CK continues to trend down, patient will likely go home tomorrow. Fluids continued at 150/hr.

## 2016-04-03 NOTE — Progress Notes (Signed)
Patient ID: Holly Ponce, female   DOB: 05/25/1976, 40 y.o.   MRN: [id number] [facility] PROGRESS NOTE  Holly Ponce [id number] DOB: 1976-06-26 DOA: 03/30/2016 PCP: Gayland Curry, MD  HPI/Subjective: Patient still has some tenderness in her right thigh. Overall feels better than when she came in.  Objective: Filed Vitals:   04/03/16 0353 04/03/16 1118  BP: 134/79 135/82  Pulse: 74 85  Temp: 98.3 F (36.8 C) 98.2 F (36.8 C)  Resp: 16 18    Filed Weights   04/01/16 0410 04/02/16 0500 04/03/16 0353  Weight: 142.417 kg (313 lb 15.6 oz) 147.782 kg (325 lb 12.8 oz) 146.104 kg (322 lb 1.6 oz)    ROS: Review of Systems  Constitutional: Negative for fever and chills.  Eyes: Negative for blurred vision.  Respiratory: Negative for cough and shortness of breath.   Cardiovascular: Negative for chest pain.  Gastrointestinal: Negative for nausea, vomiting, abdominal pain, diarrhea and constipation.  Genitourinary: Negative for dysuria.  Musculoskeletal: Negative for joint pain.  Neurological: Positive for weakness. Negative for dizziness and headaches.   Exam: Physical Exam  Constitutional: She is oriented to person, place, and time.  HENT:  Nose: No mucosal edema.  Mouth/Throat: No oropharyngeal exudate or posterior oropharyngeal edema.  Eyes: Conjunctivae, EOM and lids are normal. Pupils are equal, round, and reactive to light.  Neck: No JVD present. Carotid bruit is not present. No edema present. No thyroid mass and no thyromegaly present.  Cardiovascular: S1 normal and S2 normal.  Exam reveals no gallop.   No murmur heard. Pulses:      Dorsalis pedis pulses are 2+ on the right side, and 2+ on the left side.  Respiratory: No respiratory distress. She has no wheezes. She has no rhonchi. She has no rales.  GI: Soft. Bowel sounds are normal. There is no tenderness.  Musculoskeletal:       Right ankle: She exhibits swelling.       Left ankle: She exhibits swelling.   Lymphadenopathy:    She has no cervical adenopathy.  Neurological: She is alert and oriented to person, place, and time. No cranial nerve deficit.  Skin: Skin is warm. No rash noted. Nails show no clubbing.  Psychiatric: She has a normal mood and affect.      Data Reviewed: Basic Metabolic Panel:  Recent Labs Lab 03/30/16 2130 03/31/16 0443 04/01/16 0341 04/02/16 0507  NA 136 137 136 140  K 2.7* 3.0* 3.4* 3.8  CL 103 108 107 106  CO2 21* 20* 25 28  GLUCOSE 108* 99 112* 105*  BUN 13 12 10 6   CREATININE 0.93 0.66 0.60 0.54  CALCIUM 8.9 7.8* 7.8* 8.3*  MG  --  2.0  --   --    Liver Function Tests:  Recent Labs Lab 03/30/16 2130 03/31/16 0443  AST 125* 136*  ALT 42 41  ALKPHOS 81 68  BILITOT 1.4* 1.0  PROT 8.2* 6.8  ALBUMIN 4.5 3.8   CBC:  Recent Labs Lab 03/30/16 2130 03/31/16 0443 04/01/16 0341 04/02/16 0507  WBC 22.6* 19.0* 12.0* 10.4  HGB 14.1 12.1 11.9* 12.2  HCT 41.4 35.8 36.0 36.1  MCV 86.0 85.2 87.8 86.4  PLT 327 299 268 278   Cardiac Enzymes:  Recent Labs Lab 03/30/16 2130 03/31/16 0222 04/01/16 0341 04/02/16 0507 04/03/16 0528  CKTOTAL 09811* 11774* 8428* 6064* 4337*  TROPONINI 0.39*  --   --   --   --     CBG:  Recent Labs  Lab 03/31/16 0742 04/01/16 0740 04/02/16 0739 04/03/16 0730  GLUCAP 93 103* 100* 82    Recent Results (from the past 240 hour(s))  Chlamydia/NGC rt PCR ([facility] only)     Status: None   Collection Time: 04/01/16  8:29 AM  Result Value Ref Range Status   Specimen source GC/Chlam URINE, RANDOM  Final   Chlamydia Tr NOT DETECTED NOT DETECTED Final   N gonorrhoeae NOT DETECTED NOT DETECTED Final    Comment: (NOTE) 100  This methodology has not been evaluated in pregnant women or in 200  patients with a history of hysterectomy. 300 400  This methodology will not be performed on patients less than 32  years of age.      Scheduled Meds: . aspirin EC  81 mg Oral Daily  . enoxaparin (LOVENOX) injection   40 mg Subcutaneous Q12H  . multivitamin with minerals  1 tablet Oral Daily  . sodium chloride flush  3 mL Intravenous Q12H   Continuous Infusions: . sodium chloride 150 mL/hr at 04/02/16 1900    Assessment/Plan:  1. Severe rhabdomyolysis with bilateral lower extremity leg weakness. CPK trending down to 4337 today. Continue to see trend of CPKs on a daily basis and continue IV fluid hydration. This is likely secondary to crack cocaine use. 2. Atypical chest pain secondary to crack cocaine use. Aspirin only. 3. Elevated troponin secondary to rhabdomyolysis 4. Hypokalemia replaced 5. Leukocytosis normalized  Code Status:     Code Status Orders        Start     Ordered   03/31/16 0136  Full code   Continuous     03/31/16 0138    Code Status History    Date Active Date Inactive Code Status Order ID Comments User Context   This patient has a current code status but no historical code status.     Disposition Plan: Home once CPK potentially tomorrow  Consultants:  Cardiology  Time spent: 21 minutes  [place], [place]

## 2016-04-04 LAB — BASIC METABOLIC PANEL
Anion gap: 5 (ref 5–15)
BUN: 9 mg/dL (ref 6–20)
CHLORIDE: 103 mmol/L (ref 101–111)
CO2: 28 mmol/L (ref 22–32)
Calcium: 8.5 mg/dL — ABNORMAL LOW (ref 8.9–10.3)
Creatinine, Ser: 0.57 mg/dL (ref 0.44–1.00)
GFR calc Af Amer: >60 mL/min (ref >60)
GFR calc non Af Amer: >60 mL/min (ref >60)
Glucose, Bld: 105 mg/dL — ABNORMAL HIGH (ref 65–99)
POTASSIUM: 3.8 mmol/L (ref 3.5–5.1)
SODIUM: 136 mmol/L (ref 135–145)

## 2016-04-04 LAB — CK: Total CK: 2040 U/L — ABNORMAL HIGH (ref 38–234)

## 2016-04-04 LAB — GLUCOSE, CAPILLARY: GLUCOSE-CAPILLARY: 93 mg/dL (ref 65–99)

## 2016-04-04 NOTE — Progress Notes (Signed)
Patient ID: Holly Ponce, female   DOB: 12-Feb-1976, 40 y.o.   MRN: [id number] [place] [place] was admitted to the Hospital on 03/30/2016 and Discharged  04/04/2016 and should be excused from work/school   for 12 days starting 03/30/2016 , may return to work/school without any restrictions.  Loletha Grayer M.D on 04/04/2016,at 9:32 AM  [place] at [place]

## 2016-04-04 NOTE — Progress Notes (Signed)
Patient d/c'd home, picked up by sister. Education provided, no questions at this time. Holly Ponce

## 2016-04-04 NOTE — Care Management (Signed)
Reviewed AVS. Discharging on a multivitamin. PCP ALDRIDGE,BARBARA, MD. No needs identified.

## 2016-04-04 NOTE — Discharge Instructions (Signed)
You must stay hydrated

## 2016-04-04 NOTE — Discharge Summary (Signed)
[facility] [place] NAME: Holly Ponce    MR#:  [id number]  DATE OF BIRTH:  [birth year]  DATE OF ADMISSION:  03/30/2016 ADMITTING PHYSICIAN: Max Sane, MD  DATE OF DISCHARGE: 04/04/2016 11:14 AM  PRIMARY CARE PHYSICIAN: ALDRIDGE,BARBARA, MD    ADMISSION DIAGNOSIS:  Swelling [R60.9] Cocaine abuse [F14.10] Elevated troponin [R79.89] Chest pain, unspecified chest pain type [R07.9] Weakness of lower extremity, unspecified laterality [R29.898]  DISCHARGE DIAGNOSIS:  Active Problems:   Rhabdomyolysis   SECONDARY DIAGNOSIS:   Past Medical History  Diagnosis Date  . Sleep apnea     HOSPITAL COURSE:   1. Severe rhabdomyolysis with bilateral lower extremity leg weakness. CPK peaked at 11,774 on 03/31/2016. Upon discharge CPK down to 2040. Patient initially came in with severe pain and unable to even touch her lower extremities. Upon discharge still had a little soreness in the right thigh but was able to ambulate and move around without a problem. Patient must stay hydrated as outpatient. 2. Crack cocaine abuse likely the cause of the rhabdomyolysis 3. Elevated troponin. This is not a myocardial infarction this is secondary to severe rhabdomyolysis 4. Chest pain likely vasospasm with crack cocaine use 5. Weakness of lower extremities. Resolved  DISCHARGE CONDITIONS:   Satisfactory  CONSULTS OBTAINED:  Treatment Team:  Dionisio David, MD  DRUG ALLERGIES:   Allergies  Allergen Reactions  . Tramadol   . Vicodin [Hydrocodone-Acetaminophen]     DISCHARGE MEDICATIONS:   Discharge Medication List as of 04/04/2016 10:58 AM    CONTINUE these medications which have NOT CHANGED   Details  Multiple Vitamin (MULTIVITAMIN) tablet Take 1 tablet by mouth daily., Until Discontinued, Historical Med         DISCHARGE INSTRUCTIONS:   Follow-up PMD one week Stay hydrated at home  If you experience worsening of your admission  symptoms, develop shortness of breath, life threatening emergency, suicidal or homicidal thoughts you must seek medical attention immediately by calling 911 or calling your MD immediately  if symptoms less severe.  You Must read complete instructions/literature along with all the possible adverse reactions/side effects for all the Medicines you take and that have been prescribed to you. Take any new Medicines after you have completely understood and accept all the possible adverse reactions/side effects.   Please note  You were cared for by a hospitalist during your hospital stay. If you have any questions about your discharge medications or the care you received while you were in the hospital after you are discharged, you can call the unit and asked to speak with the hospitalist on call if the hospitalist that took care of you is not available. Once you are discharged, your primary care physician will handle any further medical issues. Please note that NO REFILLS for any discharge medications will be authorized once you are discharged, as it is imperative that you return to your primary care physician (or establish a relationship with a primary care physician if you do not have one) for your aftercare needs so that they can reassess your need for medications and monitor your lab values.    Today   CHIEF COMPLAINT:   Chief Complaint  Patient presents with  . Drug Problem    HISTORY OF PRESENT ILLNESS:  Holly Ponce  is a 40 y.o. female presented with bilateral lower extremity leg pain and weakness. Found to have rhabdomyolysis.   VITAL SIGNS:  Blood pressure 138/75, pulse 75, temperature 97.8 F (36.6  C), temperature source Oral, resp. rate 20, height 5\' 5"  (1.651 m), weight 144.743 kg (319 lb 1.6 oz), SpO2 98 %.    PHYSICAL EXAMINATION:  GENERAL:  40 y.o.-year-old patient lying in the bed with no acute distress.  EYES: Pupils equal, round, reactive to light and accommodation. No  scleral icterus. Extraocular muscles intact.  HEENT: Head atraumatic, normocephalic. Oropharynx and nasopharynx clear.  NECK:  Supple, no jugular venous distention. No thyroid enlargement, no tenderness.  LUNGS: Normal breath sounds bilaterally, no wheezing, rales,rhonchi or crepitation. No use of accessory muscles of respiration.  CARDIOVASCULAR: S1, S2 normal. No murmurs, rubs, or gallops.  ABDOMEN: Soft, non-tender, non-distended. Bowel sounds present. No organomegaly or mass.  EXTREMITIES: Trace edema, no cyanosis, or clubbing.  NEUROLOGIC: Cranial nerves II through XII are intact. Muscle strength 5/5 in all extremities. Sensation intact. Gait not checked.  PSYCHIATRIC: The patient is alert and oriented x 3.  SKIN: No obvious rash, lesion, or ulcer.   DATA REVIEW:   CBC  Recent Labs Lab 04/02/16 0507  WBC 10.4  HGB 12.2  HCT 36.1  PLT 278    Chemistries   Recent Labs Lab 03/31/16 0443  04/04/16 0459  NA 137  < > 136  K 3.0*  < > 3.8  CL 108  < > 103  CO2 20*  < > 28  GLUCOSE 99  < > 105*  BUN 12  < > 9  CREATININE 0.66  < > 0.57  CALCIUM 7.8*  < > 8.5*  MG 2.0  --   --   AST 136*  --   --   ALT 41  --   --   ALKPHOS 68  --   --   BILITOT 1.0  --   --   < > = values in this interval not displayed.  Cardiac Enzymes  Recent Labs Lab 03/30/16 2130  TROPONINI 0.39*    Microbiology Results  Results for orders placed or performed during the hospital encounter of 03/30/16  [place] rt PCR ([place] only)     Status: None   Collection Time: 04/01/16  8:29 AM  Result Value Ref Range Status   Specimen source GC/Chlam URINE, RANDOM  Final   Chlamydia Tr NOT DETECTED NOT DETECTED Final   N gonorrhoeae NOT DETECTED NOT DETECTED Final    Comment: (NOTE) 100  This methodology has not been evaluated in pregnant women or in 39  patients with a history of hysterectomy. 300 400  This methodology will not be performed on patients less than 39  years of age.      Management plans discussed with the patient, And she is in agreement.  CODE STATUS:  Code Status History    Date Active Date Inactive Code Status Order ID Comments User Context   03/31/2016  1:38 AM 04/04/2016  2:14 PM Full Code [id number]  Max Sane, MD Inpatient      TOTAL TIME TAKING CARE OF THIS PATIENT: 35 minutes.  greater than 50% time spent in coordination of care and counseling.    Loletha Grayer M.D on 04/04/2016 at 3:38 PM  Between 7am to 6pm - Pager - [phone]  After 6pm go to [email or web address] - password [email or web address]  [facility] Office  [phone]  CC: Primary care physician; Gayland Curry, MD

## 2017-07-21 ENCOUNTER — Emergency Department: Payer: Self-pay

## 2017-07-21 ENCOUNTER — Emergency Department
Admission: EM | Admit: 2017-07-21 | Discharge: 2017-07-22 | Disposition: A | Discharge status: Home / Self Care | Payer: Self-pay | Attending: Emergency Medicine | Admitting: Emergency Medicine

## 2017-07-21 ENCOUNTER — Encounter: Payer: Self-pay | Admitting: *Deleted

## 2017-07-21 DIAGNOSIS — Z79899 Other long term (current) drug therapy: Secondary | ICD-10-CM | POA: Insufficient documentation

## 2017-07-21 DIAGNOSIS — R1030 Lower abdominal pain, unspecified: Secondary | ICD-10-CM | POA: Insufficient documentation

## 2017-07-21 DIAGNOSIS — N939 Abnormal uterine and vaginal bleeding, unspecified: Secondary | ICD-10-CM | POA: Insufficient documentation

## 2017-07-21 DIAGNOSIS — N3001 Acute cystitis with hematuria: Secondary | ICD-10-CM | POA: Insufficient documentation

## 2017-07-21 DIAGNOSIS — N390 Urinary tract infection, site not specified: Secondary | ICD-10-CM

## 2017-07-21 DIAGNOSIS — A599 Trichomoniasis, unspecified: Secondary | ICD-10-CM | POA: Insufficient documentation

## 2017-07-21 DIAGNOSIS — R319 Hematuria, unspecified: Secondary | ICD-10-CM

## 2017-07-21 HISTORY — DX: Essential (primary) hypertension: I10

## 2017-07-21 LAB — URINALYSIS, COMPLETE (UACMP) WITH MICROSCOPIC
BACTERIA UA: NONE SEEN
Bilirubin Urine: NEGATIVE
GLUCOSE, UA: NEGATIVE mg/dL
KETONES UR: 20 mg/dL — AB
NITRITE: NEGATIVE
PROTEIN: 100 mg/dL — AB
Specific Gravity, Urine: 1.023 (ref 1.005–1.030)
pH: 6 (ref 5.0–8.0)

## 2017-07-21 LAB — COMPREHENSIVE METABOLIC PANEL
ALBUMIN: 3.7 g/dL (ref 3.5–5.0)
ALT: 14 U/L (ref 14–54)
ANION GAP: 8 (ref 5–15)
AST: 15 U/L (ref 15–41)
Alkaline Phosphatase: 84 U/L (ref 38–126)
BILIRUBIN TOTAL: 0.6 mg/dL (ref 0.3–1.2)
BUN: 14 mg/dL (ref 6–20)
CHLORIDE: 105 mmol/L (ref 101–111)
CO2: 25 mmol/L (ref 22–32)
Calcium: 8.8 mg/dL — ABNORMAL LOW (ref 8.9–10.3)
Creatinine, Ser: 0.77 mg/dL (ref 0.44–1.00)
GFR calc Af Amer: >60 mL/min (ref >60)
GFR calc non Af Amer: >60 mL/min (ref >60)
GLUCOSE: 94 mg/dL (ref 65–99)
POTASSIUM: 3.8 mmol/L (ref 3.5–5.1)
Sodium: 138 mmol/L (ref 135–145)
TOTAL PROTEIN: 6.8 g/dL (ref 6.5–8.1)

## 2017-07-21 LAB — WET PREP, GENITAL
Clue Cells Wet Prep HPF POC: NONE SEEN
SPERM: NONE SEEN
Yeast Wet Prep HPF POC: NONE SEEN

## 2017-07-21 LAB — CBC
HEMATOCRIT: 38.6 % (ref 35.0–47.0)
HEMOGLOBIN: 13.1 g/dL (ref 12.0–16.0)
MCH: 29 pg (ref 26.0–34.0)
MCHC: 34 g/dL (ref 32.0–36.0)
MCV: 85.1 fL (ref 80.0–100.0)
Platelets: 322 10*3/uL (ref 150–440)
RBC: 4.53 MIL/uL (ref 3.80–5.20)
RDW: 14.6 % — ABNORMAL HIGH (ref 11.5–14.5)
WBC: 7.6 10*3/uL (ref 3.6–11.0)

## 2017-07-21 LAB — CHLAMYDIA/NGC RT PCR (ARMC ONLY)
CHLAMYDIA TR: NOT DETECTED
N GONORRHOEAE: NOT DETECTED

## 2017-07-21 LAB — POCT PREGNANCY, URINE: PREG TEST UR: NEGATIVE

## 2017-07-21 MED ORDER — OXYCODONE-ACETAMINOPHEN 5-325 MG PO TABS: 1.0000 | ORAL_TABLET | Freq: Four times a day (QID) | ORAL | 0 refills | Status: DC | PRN

## 2017-07-21 MED ORDER — NITROFURANTOIN MACROCRYSTAL 100 MG PO CAPS
100.0000 mg | ORAL_CAPSULE | Freq: Once | ORAL | Status: DC
  Filled 2017-07-21: qty 1

## 2017-07-21 MED ORDER — CEPHALEXIN 500 MG PO CAPS: 500.0000 mg | ORAL_CAPSULE | Freq: Two times a day (BID) | ORAL | 0 refills | Status: DC

## 2017-07-21 MED ORDER — IOPAMIDOL (ISOVUE-300) INJECTION 61%
100.0000 mL | Freq: Once | INTRAVENOUS | Status: AC | PRN
  Administered 2017-07-21: 100 mL via INTRAVENOUS

## 2017-07-21 MED ORDER — IBUPROFEN 800 MG PO TABS: 800.0000 mg | ORAL_TABLET | Freq: Once | ORAL | Status: DC

## 2017-07-21 MED ORDER — DOXYCYCLINE HYCLATE 100 MG PO TABS: 100.0000 mg | ORAL_TABLET | Freq: Two times a day (BID) | ORAL | 0 refills | Status: DC

## 2017-07-21 MED ORDER — METRONIDAZOLE 500 MG PO TABS: 500.0000 mg | ORAL_TABLET | Freq: Two times a day (BID) | ORAL | 0 refills | Status: AC

## 2017-07-21 MED ORDER — KETOROLAC TROMETHAMINE 60 MG/2ML IM SOLN
60.0000 mg | Freq: Once | INTRAMUSCULAR | Status: AC
  Administered 2017-07-21: 60 mg via INTRAMUSCULAR
  Filled 2017-07-21: qty 2

## 2017-07-21 MED ORDER — CEFTRIAXONE SODIUM 250 MG IJ SOLR
250.0000 mg | Freq: Once | INTRAMUSCULAR | Status: AC
  Administered 2017-07-22: 250 mg via INTRAMUSCULAR
  Filled 2017-07-21: qty 250

## 2017-07-21 MED ORDER — DOXYCYCLINE HYCLATE 100 MG PO TABS
100.0000 mg | ORAL_TABLET | Freq: Once | ORAL | Status: AC
  Administered 2017-07-21: 100 mg via ORAL
  Filled 2017-07-21: qty 1

## 2017-07-21 MED ORDER — METRONIDAZOLE 500 MG PO TABS
500.0000 mg | ORAL_TABLET | Freq: Once | ORAL | Status: AC
  Administered 2017-07-21: 500 mg via ORAL
  Filled 2017-07-21: qty 1

## 2017-07-21 MED ORDER — MORPHINE SULFATE (PF) 4 MG/ML IV SOLN
4.0000 mg | Freq: Once | INTRAVENOUS | Status: AC
  Administered 2017-07-21: 4 mg via INTRAVENOUS
  Filled 2017-07-21: qty 1

## 2017-07-21 MED ORDER — IOPAMIDOL (ISOVUE-300) INJECTION 61%
30.0000 mL | Freq: Once | INTRAVENOUS | Status: AC | PRN
  Administered 2017-07-21: 30 mL via ORAL

## 2017-07-21 MED ORDER — NITROFURANTOIN MONOHYD MACRO 100 MG PO CAPS
100.0000 mg | ORAL_CAPSULE | Freq: Once | ORAL | Status: AC
  Administered 2017-07-21: 100 mg via ORAL
  Filled 2017-07-21: qty 1

## 2017-07-21 MED ORDER — NITROFURANTOIN MONOHYD MACRO 100 MG PO CAPS: 100.0000 mg | ORAL_CAPSULE | Freq: Once | ORAL | Status: DC

## 2017-07-21 NOTE — ED Notes (Signed)
poct pregnancy Negative 

## 2017-07-21 NOTE — ED Notes (Signed)
Pt refused morphine at this time - she stated that she will only take it right before she goes to CT

## 2017-07-21 NOTE — ED Notes (Signed)
Pt asleep - this nurse woke pt and encouraged to finish drinking contrast

## 2017-07-21 NOTE — ED Notes (Signed)
CT notified that pt has finished drinking contrast - pt states she will now take the morphine

## 2017-07-21 NOTE — ED Provider Notes (Signed)
[facility] Emergency Department Provider Note  Time seen: 3:44 PM  I have reviewed the triage vital signs and the nursing notes.   HISTORY  Chief Complaint Abdominal Pain and Vaginal Bleeding    HPI Holly Ponce is a 41 y.o. female with a past medical history of fibroids who presents to the emergency department with lower abdominal pain. According to the patient since yesterday she has been having lower abdominal cramping and today started with vaginal bleeding. She states her period is not supposed to start him to the 20th, so she thinks this is something different. Her last period was July 20. Denies upper abdominal pain, nausea, vomiting, diarrhea. Does state mild dysuria since yesterday as well. Patient has a history of heavy periods with significant lower abdominal cramping and a history of fibroids. Patient states her gynecologist has discussed a hysterectomy in the past however she has not had any children and they are holding off at this time. Denies any known discharge.  Past Medical History:  Diagnosis Date  . Sleep apnea     Patient Active Problem List   Diagnosis Date Noted  . Rhabdomyolysis 03/31/2016    No past surgical history on file.  Prior to Admission medications   Medication Sig Start Date End Date Taking? Authorizing Provider  Multiple Vitamin (MULTIVITAMIN) tablet Take 1 tablet by mouth daily.    [provider]    Allergies  Allergen Reactions  . Tramadol   . Vicodin [Hydrocodone-Acetaminophen]     No family history on file.  Social History Social History  Substance Use Topics  . Smoking status: Never Smoker  . Smokeless tobacco: Never Used  . Alcohol use No    Review of Systems Constitutional: Negative for fever Cardiovascular: Negative for chest pain. Respiratory: Negative for shortness of breath. Gastrointestinal: Positive for lower abdominal pain/cramping, moderate in severity. Negative for nausea  vomiting or diarrhea Genitourinary: Mild dysuria 2 days. Musculoskeletal: Negative for leg pain or swelling Neurological: Negative for headache All other ROS negative  ____________________________________________   PHYSICAL EXAM:  VITAL SIGNS: ED Triage Vitals  Enc Vitals Group     BP 07/21/17 1513 (!) 163/110     Pulse Rate 07/21/17 1513 78     Resp 07/21/17 1513 20     Temp 07/21/17 1513 98.2 F (36.8 C)     Temp Source 07/21/17 1513 Oral     SpO2 07/21/17 1513 98 %     Weight 07/21/17 1510 247 lb (112 kg)     Height 07/21/17 1510 5\' 5"  (1.651 m)     Head Circumference --      Peak Flow --      Pain Score 07/21/17 1510 8     Pain Loc --      Pain Edu? --      Excl. in [place]? --     Constitutional: Alert and oriented. Well appearing and in no distress. Eyes: Normal exam ENT   Head: Normocephalic and atraumatic.   Mouth/Throat: Mucous membranes are moist. Cardiovascular: Normal rate, regular rhythm. No murmur Respiratory: Normal respiratory effort without tachypnea nor retractions. Breath sounds are clear  Gastrointestinal: Soft and nontender. No distention. Musculoskeletal: Nontender with normal range of motion in all extremities.  Neurologic:  Normal speech and language. No gross focal neurologic deficits Skin:  Skin is warm, dry and intact.  Psychiatric: Mood and affect are normal.   ____________________________________________    RADIOLOGY  IMPRESSION: Within the left adnexa there is  an approximately 4 cm structure with homogeneous low-level internal echogenicity. This structure appears adjacent to the left ovary and may represent a dilated fallopian tube with internal complicated debris, potentially in the setting of pyosalpinx. Additional consideration would be complex left adnexal fluid collection or endometrioma. Consider further evaluation with CT as clinically indicated. If no additional imaging is performed at this time, short-term follow-up  pelvic ultrasound in 2-3 weeks would be recommended depending upon clinical symptomatology.  Enlarged left ovary with multiple complicated cysts. Recommend attention on follow-up pelvic ultrasound.  Suggestion of possible endometrial echogenic mass which may represent a polyp versus focal endometrial hyperplasia versus endometrial carcinoma. Consider sonohysterogram for further evaluation, prior to hysteroscopy or endometrial biopsy.  The right ovary is normal in appearance however there is limited Doppler evaluation, particularly arterial flow, potentially secondary to ovarian location. Recommend clinical correlation for right adnexal pain.  Fibroid uterus.   IMPRESSION: 1. Severe urinary bladder wall thickening and surrounding inflammation, consistent with acute cystitis. 2. Dilated tubular structure in the left adnexa, as demonstrated on concomitant pelvic ultrasound, concerning for hydrosalpinx or pyosalpinx. 3. A clear source of vaginal bleeding is not identified on this examination. 4. According to the provided clinical history, the patient is supposed to have an intrauterine contraceptive device. However, there is no such device present in the endometrial cavity or within the peritoneal cavity.  ____________________________________________   INITIAL IMPRESSION / ASSESSMENT AND PLAN / ED COURSE  Pertinent labs & imaging results that were available during my care of the patient were reviewed by me and considered in my medical decision making (see chart for details).  Patient presents to the emergency department for lower abdominal pain/cramping and vaginal bleeding. Patient has mild suprapubic tenderness to palpation. No rebound or guarding. No upper abdominal discomfort. We will check labs including urinalysis. Perform pelvic examination and send swabs. We'll obtain a pelvic ultrasound as well to further evaluate.  Patient's results positive for trichomoniasis as well  as a urinary tract infection. Patient's ultrasound/CT concerning for possible hydrosalpinx versus pyosalpinx as well as a urinary tract infection. I discussed the patient with Dr. Star Age of OB/GYN who recommends treatment with doxycycline, IM Rocephin and oral Flagyl.  He also offered admission for IV MI since the patient did not feel comfortable doing this. I offered the patient admission to the hospital versus home with oral medications, the patient at this time much prefers to go home. Patient is afebrile with normal vitals including a normal heart rate. Workup consistent with urinary tract infection. We will discharge unfortunately with 3 antibiotics for sufficient coverage including doxycycline, Keflex and Flagyl. We will also discharge was short course of pain medication. The patient is to follow-up with Dr. Star Age I will provide his number for the patient to obtain a appointment. I discussed strict return precautions for any fever or increased pain.  ____________________________________________   FINAL CLINICAL IMPRESSION(S) / ED DIAGNOSES  Lower abdominal pain/cramping Vaginal bleeding Trichomoniasis Urinary tract infection   Harvest Dark, MD 07/21/17 2352

## 2017-07-21 NOTE — ED Notes (Signed)
Pt reports vaginal bleeding and lower abd pain - she reports that this is not the time for her period - pt denies N/V/diarrhea - pt reports pain with urination and pressure when voiding since yesterday

## 2017-07-21 NOTE — ED Triage Notes (Signed)
Pt reports vaginal bleeding since yesterday with abd pain.  Last menses in July.  Pt has an iud. Pt cramping.  Pt alert

## 2017-07-21 NOTE — ED Notes (Signed)
Patient transported to Ultrasound 

## 2017-07-24 LAB — URINE CULTURE: Culture: >=100000 — AB

## 2017-12-01 ENCOUNTER — Emergency Department
Admission: EM | Admit: 2017-12-01 | Discharge: 2017-12-01 | Disposition: A | Discharge status: Home / Self Care | Payer: Self-pay | Attending: Emergency Medicine | Admitting: Emergency Medicine

## 2017-12-01 ENCOUNTER — Other Ambulatory Visit: Payer: Self-pay

## 2017-12-01 DIAGNOSIS — D259 Leiomyoma of uterus, unspecified: Secondary | ICD-10-CM | POA: Insufficient documentation

## 2017-12-01 DIAGNOSIS — Z79899 Other long term (current) drug therapy: Secondary | ICD-10-CM | POA: Insufficient documentation

## 2017-12-01 DIAGNOSIS — N946 Dysmenorrhea, unspecified: Secondary | ICD-10-CM | POA: Insufficient documentation

## 2017-12-01 DIAGNOSIS — I10 Essential (primary) hypertension: Secondary | ICD-10-CM | POA: Insufficient documentation

## 2017-12-01 LAB — CBC
HEMATOCRIT: 33.5 % — AB (ref 35.0–47.0)
HEMOGLOBIN: 10.8 g/dL — AB (ref 12.0–16.0)
MCH: 24.3 pg — AB (ref 26.0–34.0)
MCHC: 32.2 g/dL (ref 32.0–36.0)
MCV: 75.7 fL — ABNORMAL LOW (ref 80.0–100.0)
Platelets: 312 10*3/uL (ref 150–440)
RBC: 4.43 MIL/uL (ref 3.80–5.20)
RDW: 16.8 % — ABNORMAL HIGH (ref 11.5–14.5)
WBC: 8.8 10*3/uL (ref 3.6–11.0)

## 2017-12-01 LAB — COMPREHENSIVE METABOLIC PANEL
ALBUMIN: 3.4 g/dL — AB (ref 3.5–5.0)
ALK PHOS: 69 U/L (ref 38–126)
ALT: 13 U/L — ABNORMAL LOW (ref 14–54)
ANION GAP: 7 (ref 5–15)
AST: 20 U/L (ref 15–41)
BUN: 16 mg/dL (ref 6–20)
CO2: 23 mmol/L (ref 22–32)
Calcium: 8.2 mg/dL — ABNORMAL LOW (ref 8.9–10.3)
Chloride: 106 mmol/L (ref 101–111)
Creatinine, Ser: 1.09 mg/dL — ABNORMAL HIGH (ref 0.44–1.00)
GFR calc non Af Amer: >60 mL/min (ref >60)
GLUCOSE: 108 mg/dL — AB (ref 65–99)
POTASSIUM: 3.7 mmol/L (ref 3.5–5.1)
SODIUM: 136 mmol/L (ref 135–145)
TOTAL PROTEIN: 6.3 g/dL — AB (ref 6.5–8.1)
Total Bilirubin: 0.4 mg/dL (ref 0.3–1.2)

## 2017-12-01 LAB — URINALYSIS, COMPLETE (UACMP) WITH MICROSCOPIC
BACTERIA UA: NONE SEEN
SPECIFIC GRAVITY, URINE: 1.025 (ref 1.005–1.030)

## 2017-12-01 LAB — LIPASE, BLOOD: LIPASE: 36 U/L (ref 11–51)

## 2017-12-01 MED ORDER — KETOROLAC TROMETHAMINE 30 MG/ML IJ SOLN
15.0000 mg | INTRAMUSCULAR | Status: AC
  Administered 2017-12-01: 15 mg via INTRAVENOUS
  Filled 2017-12-01: qty 1

## 2017-12-01 MED ORDER — ACETAMINOPHEN 500 MG PO TABS
1000.0000 mg | ORAL_TABLET | Freq: Once | ORAL | Status: DC
  Filled 2017-12-01 (×2): qty 2

## 2017-12-01 MED ORDER — ACETAMINOPHEN 500 MG PO TABS
1000.0000 mg | ORAL_TABLET | Freq: Once | ORAL | Status: AC
  Administered 2017-12-01: 1000 mg via ORAL

## 2017-12-01 NOTE — ED Notes (Addendum)
Pt states right lower abd pain since yesterday afternoon. Pt reports period began early and is having clots, although less than normal. Pt denies N/V/D.  Pt reports known fibroids and ovarian cysts at this time. States being followed by [facility]

## 2017-12-01 NOTE — ED Triage Notes (Addendum)
Pt presents to ED with c/o generalized abd pain since yesterday. When asked what made her come to the ED this morning Pt states she has "been dx with cysts and fibroids and all kids of stuff before".  Unsure of location of these issues and pt states they should be on her chart somewhere. Reports vaginal bleeding since yesterday that is "a little more heavy than normal". Pt appears very uncomfortable and restless at this time.

## 2017-12-01 NOTE — ED Notes (Signed)
Patient upset and cussing at this RN for relaying to her we will not be giving her morphine. Patient asked for her IV to be taken out so she can leave. Patient left without letting RN take discharge vitals and discharge paperwork

## 2017-12-01 NOTE — ED Provider Notes (Signed)
Holly Ponce  ____________________________________________  Time seen: Approximately 7:59 AM  I have reviewed the triage vital signs and the nursing notes.   HISTORY  Chief Complaint Abdominal Pain    HPI Holly Ponce is a 41 y.o. female who complains of lower abdominal pain since yesterday. She also had vaginal bleeding related to her menses that started yesterday. She has a history of painful menses with heavy bleeding, found to have a left ovarian cyst as well as fibroid uterus in the past. She reports that her current pain is nonradiating, moderate intensity, cramping without aggravating or alleviating factors. It feels like prior menstrual pain that she's had in the past but not as severe. Her bleeding is not as severe as it has been in the past and is more like a normal period currently.   Review of electronic medical record show she's had pelvic ultrasound and CT scan of the abdomen and pelvis at this hospital within the last few months. She's also been seen at [facility] within the last few months, where she was observed for several hours, had serial CBC that showed stable H&H, had phone consult with gynecology where they arranged outpatient gynecology follow-up with specialty surgery as well as consideration of outpatient MRI.Marland KitchenShe has not followed up with gynecology in the last few months since that evaluation.     Past Medical History:  Diagnosis Date  . Hypertension   . Sleep apnea   Uterine fibroid   Patient Active Problem List   Diagnosis Date Noted  . Rhabdomyolysis 03/31/2016     No past surgical history on file. Uterine fibroidectomy  Prior to Admission medications   Medication Sig Start Date End Date Taking? Authorizing Provider  cephALEXin (KEFLEX) 500 MG capsule Take 1 capsule (500 mg total) by mouth 2 (two) times daily. 07/21/17   Harvest Dark, MD  doxycycline (VIBRA-TABS) 100 MG tablet Take 1  tablet (100 mg total) by mouth 2 (two) times daily. 07/21/17   Harvest Dark, MD  ibuprofen (ADVIL,MOTRIN) 400 MG tablet Take 400 mg by mouth every 6 (six) hours as needed.    [provider]  Multiple Vitamin (MULTIVITAMIN) tablet Take 1 tablet by mouth daily.    [provider]  oxyCODONE-acetaminophen (ROXICET) 5-325 MG tablet Take 1 tablet by mouth every 6 (six) hours as needed. 07/21/17   Harvest Dark, MD     Allergies Tramadol and Vicodin [hydrocodone-acetaminophen]   No family history on file.  Social History Social History   Tobacco Use  . Smoking status: Never Smoker  . Smokeless tobacco: Never Used  Substance Use Topics  . Alcohol use: No  . Drug use: Not on file    Review of Systems  Constitutional:   No fever or chills.  ENT:   No sore throat. No rhinorrhea. Cardiovascular:   No chest pain or syncope. Respiratory:   No dyspnea or cough. Gastrointestinal:   Negative for abdominal pain, vomiting and diarrhea.  Musculoskeletal:   Negative for focal pain or swelling All other systems reviewed and are negative except as documented above in ROS and HPI.  ____________________________________________   PHYSICAL EXAM:  VITAL SIGNS: ED Triage Vitals  Enc Vitals Group     BP 12/01/17 0406 (!) 164/98     Pulse Rate 12/01/17 0406 69     Resp 12/01/17 0406 20     Temp 12/01/17 0406 98.4 F (36.9 C)     Temp Source 12/01/17 0406 Oral  SpO2 12/01/17 0406 100 %     Weight 12/01/17 0407 240 lb (108.9 kg)     Height 12/01/17 0407 5\' 4"  (1.626 m)     Head Circumference --      Peak Flow --      Pain Score 12/01/17 0405 10     Pain Loc --      Pain Edu? --      Excl. in [place]? --     Vital signs reviewed, nursing assessments reviewed.   Constitutional:   Alert and oriented. Well appearing and in no distress. Eyes:   No scleral icterus.  EOMI. No nystagmus. No conjunctival pallor. PERRL. ENT   Head:   Normocephalic and atraumatic.    Nose:   No congestion/rhinnorhea.    Mouth/Throat:   MMM, no pharyngeal erythema. No peritonsillar mass.    Neck:   No meningismus. Full ROM. Hematological/Lymphatic/Immunilogical:   No cervical lymphadenopathy. Cardiovascular:   RRR. Symmetric bilateral radial and DP pulses.  No murmurs.  Respiratory:   Normal respiratory effort without tachypnea/retractions. Breath sounds are clear and equal bilaterally. No wheezes/rales/rhonchi. Gastrointestinal:   Soft with mild suprapubic tenderness. No right lower quadrant or left lower quadrant tenderness or large mass.. Non distended.No rebound, rigidity, or guarding. Genitourinary:   deferred Musculoskeletal:   Normal range of motion in all extremities. No joint effusions.  No lower extremity tenderness.  No edema. Neurologic:   Normal speech and language.  Motor grossly intact. No gross focal neurologic deficits are appreciated.  Skin:    Skin is warm, dry and intact. No rash noted.  No petechiae, purpura, or bullae.  ____________________________________________    LABS (pertinent positives/negatives) (all labs ordered are listed, but only abnormal results are displayed) Labs Reviewed  COMPREHENSIVE METABOLIC PANEL - Abnormal; Notable for the following components:      Result Value   Glucose, Bld 108 (*)    Creatinine, Ser 1.09 (*)    Calcium 8.2 (*)    Total Protein 6.3 (*)    Albumin 3.4 (*)    ALT 13 (*)    All other components within normal limits  CBC - Abnormal; Notable for the following components:   Hemoglobin 10.8 (*)    HCT 33.5 (*)    MCV 75.7 (*)    MCH 24.3 (*)    RDW 16.8 (*)    All other components within normal limits  URINALYSIS, COMPLETE (UACMP) WITH MICROSCOPIC - Abnormal; Notable for the following components:   Color, Urine RED (*)    APPearance CLOUDY (*)    Glucose, UA   (*)    Value: TEST NOT REPORTED DUE TO COLOR INTERFERENCE OF URINE PIGMENT   Hgb urine dipstick   (*)    Value: TEST NOT REPORTED  DUE TO COLOR INTERFERENCE OF URINE PIGMENT   Bilirubin Urine   (*)    Value: TEST NOT REPORTED DUE TO COLOR INTERFERENCE OF URINE PIGMENT   Ketones, ur   (*)    Value: TEST NOT REPORTED DUE TO COLOR INTERFERENCE OF URINE PIGMENT   Protein, ur   (*)    Value: TEST NOT REPORTED DUE TO COLOR INTERFERENCE OF URINE PIGMENT   Nitrite   (*)    Value: TEST NOT REPORTED DUE TO COLOR INTERFERENCE OF URINE PIGMENT   Leukocytes, UA   (*)    Value: TEST NOT REPORTED DUE TO COLOR INTERFERENCE OF URINE PIGMENT   Squamous Epithelial / LPF 6-30 (*)    All other  components within normal limits  LIPASE, BLOOD  POC URINE PREG, ED   ____________________________________________   EKG    ____________________________________________    RADIOLOGY  No results found.  ____________________________________________   PROCEDURES Procedures  ____________________________________________     CLINICAL IMPRESSION / ASSESSMENT AND PLAN / ED COURSE  Pertinent labs & imaging results that were available during my care of the patient were reviewed by me and considered in my medical decision making (see chart for details).   Patient well-appearing no acute distress, unremarkable vital signs, presents with recurrence of chronic pelvic pain related to her menstrual cycle and fibroids. Counseled patient that with her extensive workup already completed, vitals and hemoglobin stable today and no new symptoms and reassuring exam otherwise, next step in her management is to follow-up with gynecology for consideration of fibroidectomy versus hysterectomy depending on fertility wishes. Also reiterated to her the need to follow-up for consideration of MRI especially with the 4 cm complex mass found at the left adnexa.  She does not need to have this emergently today, but I did Ponce that there is a chance this could be neoplastic which would guide further management if suspicious on MRI and raise the need for early  surgery.  For her chronic recurrent pain, patient given Tylenol and Toradol. The nurse reports that the patient then demanded opioids, but I don't feel this is appropriate given the nature of this pain syndrome. Patient is stable for discharge and plan for outpatient follow-up, but left the ED suddenly after receiving medications without waiting for her discharge paperwork. I had looked up and provided the contact information for the [facility] gynecology clinic to assist her in following up.      ____________________________________________   FINAL CLINICAL IMPRESSION(S) / ED DIAGNOSES    Final diagnoses:  Dysmenorrhea  Uterine leiomyoma, unspecified location      This SmartLink is deprecated. Use AVSMEDLIST instead to display the medication list for a patient.   Portions of this Ponce were generated with dragon dictation software. Dictation errors may occur despite best attempts at proofreading.    Carrie Mew, MD 12/01/17 [phone]

## 2018-08-12 ENCOUNTER — Ambulatory Visit: Payer: Self-pay | Admitting: Internal Medicine

## 2019-08-05 ENCOUNTER — Other Ambulatory Visit: Payer: Self-pay

## 2019-08-05 DIAGNOSIS — Z20822 Contact with and (suspected) exposure to covid-19: Secondary | ICD-10-CM

## 2019-08-06 LAB — NOVEL CORONAVIRUS, NAA: SARS-CoV-2, NAA: NOT DETECTED

## 2019-08-08 ENCOUNTER — Telehealth: Payer: Self-pay | Admitting: General Practice

## 2019-08-08 NOTE — Telephone Encounter (Signed)
Patient is calling to receive her negative COVID results. Patient expressed understanding. °

## 2021-04-16 ENCOUNTER — Encounter: Payer: Self-pay | Admitting: Emergency Medicine

## 2021-04-16 ENCOUNTER — Emergency Department
Admission: EM | Admit: 2021-04-16 | Discharge: 2021-04-16 | Disposition: A | Discharge status: Home / Self Care | Payer: Self-pay | Attending: Emergency Medicine | Admitting: Emergency Medicine

## 2021-04-16 ENCOUNTER — Other Ambulatory Visit: Payer: Self-pay

## 2021-04-16 DIAGNOSIS — S0501XA Injury of conjunctiva and corneal abrasion without foreign body, right eye, initial encounter: Secondary | ICD-10-CM | POA: Insufficient documentation

## 2021-04-16 DIAGNOSIS — X58XXXA Exposure to other specified factors, initial encounter: Secondary | ICD-10-CM | POA: Insufficient documentation

## 2021-04-16 DIAGNOSIS — I10 Essential (primary) hypertension: Secondary | ICD-10-CM | POA: Insufficient documentation

## 2021-04-16 MED ORDER — OXYCODONE-ACETAMINOPHEN 5-325 MG PO TABS
2.0000 | ORAL_TABLET | Freq: Once | ORAL | Status: AC
  Administered 2021-04-16: 2 via ORAL
  Filled 2021-04-16: qty 2

## 2021-04-16 MED ORDER — ONDANSETRON 4 MG PO TBDP
4.0000 mg | ORAL_TABLET | Freq: Once | ORAL | Status: AC
  Administered 2021-04-16: 4 mg via ORAL
  Filled 2021-04-16: qty 1

## 2021-04-16 MED ORDER — OXYCODONE-ACETAMINOPHEN 5-325 MG PO TABS: 1.0000 | ORAL_TABLET | ORAL | 0 refills | Status: AC | PRN

## 2021-04-16 MED ORDER — ERYTHROMYCIN 5 MG/GM OP OINT: 1.0000 "application " | TOPICAL_OINTMENT | Freq: Four times a day (QID) | OPHTHALMIC | 0 refills | Status: AC

## 2021-04-16 MED ORDER — ONDANSETRON 4 MG PO TBDP: 4.0000 mg | ORAL_TABLET | Freq: Four times a day (QID) | ORAL | 0 refills | Status: DC | PRN

## 2021-04-16 MED ORDER — TETRACAINE HCL 0.5 % OP SOLN
2.0000 [drp] | Freq: Once | OPHTHALMIC | Status: AC
  Administered 2021-04-16: 2 [drp] via OPHTHALMIC
  Filled 2021-04-16: qty 4

## 2021-04-16 MED ORDER — FLUORESCEIN SODIUM 1 MG OP STRP
1.0000 | ORAL_STRIP | Freq: Once | OPHTHALMIC | Status: AC
  Administered 2021-04-16: 1 via OPHTHALMIC
  Filled 2021-04-16: qty 1

## 2021-04-16 NOTE — ED Triage Notes (Signed)
Pt to triage via w/c with no distress noted; reports washing hair this morning and began to have pain to rt eye "burning" and sensation of foreign body

## 2021-04-16 NOTE — ED Provider Notes (Addendum)
[facility] Emergency Department Provider Note  ____________________________________________   Event Date/Time   First MD Initiated Contact with Patient 04/16/21 [phone]     (approximate)  I have reviewed the triage vital signs and the nursing notes.   HISTORY  Chief Complaint Eye Pain    HPI Holly Ponce is a 45 y.o. female history of hypertension, obstructive sleep apnea who presents to the emergency department with complaints of right eye pain that started just prior to arrival.  States she was washing her hair with normal shampoo this morning when she felt like she got some shampoo into her eye it is burning.  She began rubbing her eye and now she is having significant pain and blurry vision.  She states she feels like something is in her eye.  She wears glasses at baseline.  No contacts.  No recent eye surgery.  No other traumatic injury to the eye.  No vision loss.  No other chemical exposures.  Tried to irrigate her eye with water at home without much relief.        Past Medical History:  Diagnosis Date  . Hypertension   . Sleep apnea     Patient Active Problem List   Diagnosis Date Noted  . Rhabdomyolysis 03/31/2016    Past Surgical History:  Procedure Laterality Date  . ABDOMINAL HYSTERECTOMY      Prior to Admission medications   Medication Sig Start Date End Date Taking? Authorizing Provider  erythromycin ophthalmic ointment Place 1 application into the right eye 4 (four) times daily. For 5 days 04/16/21  Yes River Ambrosio, Cyril Mourning N, DO  ondansetron (ZOFRAN ODT) 4 MG disintegrating tablet Take 1 tablet (4 mg total) by mouth every 6 (six) hours as needed for nausea or vomiting. 04/16/21  Yes Vondell Babers, Delice Bison, DO  oxyCODONE-acetaminophen (PERCOCET) 5-325 MG tablet Take 1 tablet by mouth every 4 (four) hours as needed for severe pain. 04/16/21 04/16/22 Yes Shyleigh Daughtry, Delice Bison, DO  ibuprofen (ADVIL,MOTRIN) 400 MG tablet Take 400 mg by mouth every 6 (six) hours  as needed.    [provider]  Multiple Vitamin (MULTIVITAMIN) tablet Take 1 tablet by mouth daily.    [provider]    Allergies Tramadol and Vicodin [hydrocodone-acetaminophen]  No family history on file.  Social History Social History   Tobacco Use  . Smoking status: Never Smoker  . Smokeless tobacco: Never Used  Substance Use Topics  . Alcohol use: No    Review of Systems Constitutional: No fever. Eyes: No visual changes. ENT: No sore throat. Cardiovascular: Denies chest pain. Respiratory: Denies shortness of breath. Gastrointestinal: No nausea, vomiting, diarrhea. Genitourinary: Negative for dysuria. Musculoskeletal: Negative for back pain. Skin: Negative for rash. Neurological: Negative for focal weakness or numbness.  ____________________________________________   PHYSICAL EXAM:  VITAL SIGNS: ED Triage Vitals  Enc Vitals Group     BP 04/16/21 0608 (!) 161/96     Pulse Rate 04/16/21 0608 79     Resp 04/16/21 0608 20     Temp 04/16/21 0608 98.6 F (37 C)     Temp Source 04/16/21 0608 Oral     SpO2 04/16/21 0608 99 %     Weight 04/16/21 0605 270 lb (122.5 kg)     Height 04/16/21 0605 5\' 5"  (1.651 m)     Head Circumference --      Peak Flow --      Pain Score 04/16/21 0605 10     Pain Loc --  Pain Edu? --      Excl. in [place]? --    CONSTITUTIONAL: Alert and responds appropriately to questions.  Appears uncomfortable, tearful HEAD: Normocephalic, atraumatic EYES: Conjunctival on the right is significantly injected with chemosis.  On fluorescein staining, patient has uptake over a corneal abrasion just over her pupil with no corneal ulceration.  No foreign body appreciated but exam is very limited secondary to patient's inability to tolerate examination.  She has photophobia and tearing.  No other discharge.  No hyphema or hypopyon.  Unable to perform slit-lamp examination due to patient's intolerance.  Intraocular pressure of the right  eye is 23 mmHg after multiple attempts.  pH of the right eye is 7.  Unable to obtain visual acuity from the right eye. ENT: normal nose; moist mucous membranes NECK: Normal range of motion CARD: Regular rate and rhythm RESP: Normal chest excursion without splinting or tachypnea; no hypoxia or respiratory distress, speaking full sentences ABD/GI: non-distended EXT: Normal ROM in all joints, no major deformities noted SKIN: Normal color for age and race, no rashes on exposed skin NEURO: Moves all extremities equally, normal speech, no facial asymmetry noted PSYCH: The patient's mood and manner are appropriate. Grooming and personal hygiene are appropriate.  ____________________________________________   LABS (all labs ordered are listed, but only abnormal results are displayed)  Labs Reviewed - No data to display ____________________________________________  EKG   ____________________________________________  RADIOLOGY I, Miaisabella Bacorn, personally viewed and evaluated these images (plain radiographs) as part of my medical decision making, as well as reviewing the written report by the radiologist.  ED MD interpretation:    Official radiology report(s): No results found.  ____________________________________________   PROCEDURES  Procedure(s) performed (including Critical Care):  Procedures   ____________________________________________   INITIAL IMPRESSION / ASSESSMENT AND PLAN / ED COURSE  As part of my medical decision making, I reviewed the following data within the [place] notes reviewed and incorporated, Old chart reviewed, Notes from prior ED visits and [place] Controlled Substance Database         Patient here with large right-sided corneal abrasion.  Very difficult to examine patient due to her intolerance, pain level.  No foreign body appreciated, corneal ulceration, hyphema or hypopyon.  Patient has significant photophobia and unable  to tolerate slit-lamp examination.  Intraocular pressure is normal.  Will discharge with pain medication, erythromycin ointment and outpatient ophthalmology follow-up.  pH in her eye is normal.  Attempted to irrigate with Lilia Pro lens and saline due to patient request but she was unable to tolerate this.  She denies any chemical exposure other than to over-the-counter shampoo.  Have advised her to try not to rub her eye.  Discussed return precautions.  At this time, I do not feel there is any life-threatening condition present. I have reviewed, interpreted and discussed all results (EKG, imaging, lab, urine as appropriate) and exam findings with patient/family. I have reviewed nursing notes and appropriate previous records.  I feel the patient is safe to be discharged home without further emergent workup and can continue workup as an outpatient as needed. Discussed usual and customary return precautions. Patient/family verbalize understanding and are comfortable with this plan.  Outpatient follow-up has been provided as needed. All questions have been answered.   ____________________________________________   FINAL CLINICAL IMPRESSION(S) / ED DIAGNOSES  Final diagnoses:  Abrasion of right cornea, initial encounter     ED Discharge Orders         Ordered  oxyCODONE-acetaminophen (PERCOCET) 5-325 MG tablet  Every 4 hours PRN        04/16/21 0633    ondansetron (ZOFRAN ODT) 4 MG disintegrating tablet  Every 6 hours PRN        04/16/21 0633    erythromycin ophthalmic ointment  4 times daily        04/16/21 2694          *Please note:  LORANE COUSAR was evaluated in Emergency Department on 04/16/2021 for the symptoms described in the history of present illness. She was evaluated in the context of the global COVID-19 pandemic, which necessitated consideration that the patient might be at risk for infection with the SARS-CoV-2 virus that causes COVID-19. Institutional protocols and algorithms  that pertain to the evaluation of patients at risk for COVID-19 are in a state of rapid change based on information released by regulatory bodies including the CDC and federal and state organizations. These policies and algorithms were followed during the patient's care in the ED.  Some ED evaluations and interventions may be delayed as a result of limited staffing during and the pandemic.*   Note:  This document was prepared using Dragon voice recognition software and may include unintentional dictation errors.   Wynne Jury, Delice Bison, DO 04/16/21 [place], Delice Bison, DO 04/16/21 [phone]

## 2021-05-15 ENCOUNTER — Ambulatory Visit: Payer: Self-pay

## 2021-05-31 ENCOUNTER — Ambulatory Visit: Payer: Self-pay

## 2021-06-19 ENCOUNTER — Ambulatory Visit: Payer: Self-pay

## 2021-06-30 ENCOUNTER — Emergency Department: Payer: Self-pay

## 2021-06-30 ENCOUNTER — Encounter: Payer: Self-pay | Admitting: Emergency Medicine

## 2021-06-30 ENCOUNTER — Emergency Department
Admission: EM | Admit: 2021-06-30 | Discharge: 2021-07-01 | Disposition: A | Discharge status: Home / Self Care | Payer: Self-pay | Attending: Emergency Medicine | Admitting: Emergency Medicine

## 2021-06-30 DIAGNOSIS — I10 Essential (primary) hypertension: Secondary | ICD-10-CM | POA: Insufficient documentation

## 2021-06-30 DIAGNOSIS — R519 Headache, unspecified: Secondary | ICD-10-CM

## 2021-06-30 DIAGNOSIS — Z79899 Other long term (current) drug therapy: Secondary | ICD-10-CM | POA: Insufficient documentation

## 2021-06-30 DIAGNOSIS — U071 COVID-19: Secondary | ICD-10-CM

## 2021-06-30 DIAGNOSIS — I639 Cerebral infarction, unspecified: Secondary | ICD-10-CM | POA: Insufficient documentation

## 2021-06-30 LAB — DIFFERENTIAL
Abs Immature Granulocytes: 0.05 10*3/uL (ref 0.00–0.07)
Basophils Absolute: 0 10*3/uL (ref 0.0–0.1)
Basophils Relative: 1 %
Eosinophils Absolute: 0.1 10*3/uL (ref 0.0–0.5)
Eosinophils Relative: 1 %
Immature Granulocytes: 1 %
Lymphocytes Relative: 9 %
Lymphs Abs: 0.6 10*3/uL — ABNORMAL LOW (ref 0.7–4.0)
Monocytes Absolute: 1.2 10*3/uL — ABNORMAL HIGH (ref 0.1–1.0)
Monocytes Relative: 18 %
Neutro Abs: 4.7 10*3/uL (ref 1.7–7.7)
Neutrophils Relative %: 70 %

## 2021-06-30 LAB — APTT: aPTT: 26 seconds (ref 24–36)

## 2021-06-30 LAB — COMPREHENSIVE METABOLIC PANEL
ALT: 18 U/L (ref 0–44)
AST: 17 U/L (ref 15–41)
Albumin: 3.7 g/dL (ref 3.5–5.0)
Alkaline Phosphatase: 57 U/L (ref 38–126)
Anion gap: 5 (ref 5–15)
BUN: 11 mg/dL (ref 6–20)
CO2: 25 mmol/L (ref 22–32)
Calcium: 8.6 mg/dL — ABNORMAL LOW (ref 8.9–10.3)
Chloride: 102 mmol/L (ref 98–111)
Creatinine, Ser: 0.87 mg/dL (ref 0.44–1.00)
GFR, Estimated: >60 mL/min (ref >60)
Glucose, Bld: 97 mg/dL (ref 70–99)
Potassium: 3.6 mmol/L (ref 3.5–5.1)
Sodium: 132 mmol/L — ABNORMAL LOW (ref 135–145)
Total Bilirubin: 0.6 mg/dL (ref 0.3–1.2)
Total Protein: 6.4 g/dL — ABNORMAL LOW (ref 6.5–8.1)

## 2021-06-30 LAB — CBC
HCT: 43.8 % (ref 36.0–46.0)
Hemoglobin: 14.8 g/dL (ref 12.0–15.0)
MCH: 31.2 pg (ref 26.0–34.0)
MCHC: 33.8 g/dL (ref 30.0–36.0)
MCV: 92.2 fL (ref 80.0–100.0)
Platelets: 188 10*3/uL (ref 150–400)
RBC: 4.75 MIL/uL (ref 3.87–5.11)
RDW: 12.7 % (ref 11.5–15.5)
WBC: 6.6 10*3/uL (ref 4.0–10.5)
nRBC: 0 % (ref 0.0–0.2)

## 2021-06-30 LAB — TROPONIN I (HIGH SENSITIVITY): Troponin I (High Sensitivity): 7 ng/L (ref <18)

## 2021-06-30 LAB — POC SARS CORONAVIRUS 2 AG -  ED: SARS Coronavirus 2 Ag: POSITIVE — AB

## 2021-06-30 LAB — PROTIME-INR
INR: 1 (ref 0.8–1.2)
Prothrombin Time: 13.2 seconds (ref 11.4–15.2)

## 2021-06-30 MED ORDER — PANTOPRAZOLE SODIUM 40 MG IV SOLR
40.0000 mg | Freq: Once | INTRAVENOUS | Status: AC
  Administered 2021-06-30: 40 mg via INTRAVENOUS
  Filled 2021-06-30: qty 40

## 2021-06-30 MED ORDER — SODIUM CHLORIDE 0.9 % IV BOLUS
1000.0000 mL | Freq: Once | INTRAVENOUS | Status: AC
  Administered 2021-06-30: 1000 mL via INTRAVENOUS

## 2021-06-30 MED ORDER — KETOROLAC TROMETHAMINE 30 MG/ML IJ SOLN
15.0000 mg | INTRAMUSCULAR | Status: AC
  Administered 2021-06-30: 15 mg via INTRAVENOUS
  Filled 2021-06-30: qty 1

## 2021-06-30 MED ORDER — ONDANSETRON HCL 4 MG/2ML IJ SOLN
4.0000 mg | Freq: Once | INTRAMUSCULAR | Status: AC
  Administered 2021-06-30: 4 mg via INTRAVENOUS
  Filled 2021-06-30: qty 2

## 2021-06-30 NOTE — ED Triage Notes (Addendum)
Pt crying in triage with reports constant sharp pains in the posterior head as well as numbness to bilateral legs. Pt reports this pain 20/10 and sts it started last night. Pt unable to sit still in triage and continuing to cry out while holding the back of head. Pt denies recent fall or head trauma. Pt with 101.2F in triage. Pt denies fever at home as well as cough and recent illness.   No drift noted, no facial droop present, clear speech with pt A&O x4.

## 2021-07-01 LAB — TROPONIN I (HIGH SENSITIVITY): Troponin I (High Sensitivity): 6 ng/L (ref <18)

## 2021-07-01 MED ORDER — PAXLOVID 20 X 150 MG & 10 X 100MG PO TBPK: 3.0000 | ORAL_TABLET | Freq: Two times a day (BID) | ORAL | 0 refills | Status: AC

## 2021-07-01 MED ORDER — ONDANSETRON 4 MG PO TBDP: 4.0000 mg | ORAL_TABLET | Freq: Three times a day (TID) | ORAL | 0 refills | Status: DC | PRN

## 2021-07-01 MED ORDER — NAPROXEN 500 MG PO TABS: 500.0000 mg | ORAL_TABLET | Freq: Two times a day (BID) | ORAL | 0 refills | Status: AC

## 2021-07-01 NOTE — ED Provider Notes (Signed)
[facility] Emergency Department Provider Note  ____________________________________________  Time seen: Approximately 3:26 AM  I have reviewed the triage vital signs and the nursing notes.   HISTORY  Chief Complaint Headache    HPI ARYAA BUNTING is a 45 y.o. female with a past history of high blood pressure, obesity who comes ED complaining of severe generalized headache that is been going on for 2 days, gradual onset and worsening and constant, waxing waning, nonradiating, no aggravating or alleviating factors.  No vision changes numbness tingling or motor weakness.  She does have malaise and fatigue.  Been staying with a friend for the last 3 days.    Past Medical History:  Diagnosis Date   Hypertension    Sleep apnea      Patient Active Problem List   Diagnosis Date Noted   Rhabdomyolysis 03/31/2016     Past Surgical History:  Procedure Laterality Date   ABDOMINAL HYSTERECTOMY       Prior to Admission medications   Medication Sig Start Date End Date Taking? Authorizing Provider  naproxen (NAPROSYN) 500 MG tablet Take 1 tablet (500 mg total) by mouth 2 (two) times daily with a meal. 07/01/21  Yes Carrie Mew, MD  Nirmatrelvir & Ritonavir (PAXLOVID) 20 x 150 MG & 10 x 100MG  TBPK Take 3 tablets by mouth in the morning and at bedtime for 5 days. Take according to package instruction 07/01/21 07/06/21 Yes Carrie Mew, MD  ondansetron (ZOFRAN ODT) 4 MG disintegrating tablet Take 1 tablet (4 mg total) by mouth every 8 (eight) hours as needed for nausea or vomiting. 07/01/21  Yes Carrie Mew, MD  erythromycin ophthalmic ointment Place 1 application into the right eye 4 (four) times daily. For 5 days 04/16/21   Ward, Delice Bison, DO  ibuprofen (ADVIL,MOTRIN) 400 MG tablet Take 400 mg by mouth every 6 (six) hours as needed.    [provider]  Multiple Vitamin (MULTIVITAMIN) tablet Take 1 tablet by mouth daily.    [provider]  ondansetron (ZOFRAN ODT) 4 MG disintegrating tablet Take 1 tablet (4 mg total) by mouth every 6 (six) hours as needed for nausea or vomiting. 04/16/21   Ward, Delice Bison, DO  oxyCODONE-acetaminophen (PERCOCET) 5-325 MG tablet Take 1 tablet by mouth every 4 (four) hours as needed for severe pain. 04/16/21 04/16/22  Ward, Delice Bison, DO     Allergies Tramadol and Vicodin [hydrocodone-acetaminophen]   History reviewed. No pertinent family history.  Social History Social History   Tobacco Use   Smoking status: Never   Smokeless tobacco: Never  Substance Use Topics   Alcohol use: No    Review of Systems  Constitutional:   No fever positive chills.  ENT:   No sore throat. No rhinorrhea. Cardiovascular:   No chest pain or syncope. Respiratory:   No dyspnea or cough. Gastrointestinal:   Negative for abdominal pain, vomiting and diarrhea.  Musculoskeletal:   Positive generalized body aches All other systems reviewed and are negative except as documented above in ROS and HPI.  ____________________________________________   PHYSICAL EXAM:  VITAL SIGNS: ED Triage Vitals  Enc Vitals Group     BP 06/30/21 2023 (!) 156/111     Pulse Rate 06/30/21 2023 (!) 112     Resp 06/30/21 2023 (!) 24     Temp 06/30/21 2023 (!) 101.8 F (38.8 C)     Temp Source 06/30/21 2023 Oral     SpO2 06/30/21 2023 99 %  Weight --      Height --      Head Circumference --      Peak Flow --      Pain Score 07/01/21 0307 Asleep     Pain Loc --      Pain Edu? --      Excl. in [place]? --     Vital signs reviewed, nursing assessments reviewed.   Constitutional:   Alert and oriented. Non-toxic appearance. Eyes:   Conjunctivae are normal. EOMI. PERRL.  No nystagmus ENT      Head:   Normocephalic and atraumatic.      Nose:   Wearing a mask.      Mouth/Throat:   Wearing a mask.      Neck:   No meningismus. Full ROM. Hematological/Lymphatic/Immunilogical:   No cervical  lymphadenopathy. Cardiovascular:   RRR, heart rate 90. Symmetric bilateral radial and DP pulses.  No murmurs. Cap refill less than 2 seconds. Respiratory:   Normal respiratory effort without tachypnea/retractions. Breath sounds are clear and equal bilaterally. No wheezes/rales/rhonchi. Gastrointestinal:   Soft and nontender. Non distended. There is no CVA tenderness.  No rebound, rigidity, or guarding. Genitourinary:   deferred Musculoskeletal:   Normal range of motion in all extremities. No joint effusions.  No lower extremity tenderness.  No edema. Neurologic:   Normal speech and language.  Motor grossly intact. No acute focal neurologic deficits are appreciated.  Skin:    Skin is warm, dry and intact. No rash noted.  No petechiae, purpura, or bullae.  ____________________________________________    LABS (pertinent positives/negatives) (all labs ordered are listed, but only abnormal results are displayed) Labs Reviewed  DIFFERENTIAL - Abnormal; Notable for the following components:      Result Value   Lymphs Abs 0.6 (*)    Monocytes Absolute 1.2 (*)    All other components within normal limits  COMPREHENSIVE METABOLIC PANEL - Abnormal; Notable for the following components:   Sodium 132 (*)    Calcium 8.6 (*)    Total Protein 6.4 (*)    All other components within normal limits  POC SARS CORONAVIRUS 2 AG -  ED - Abnormal; Notable for the following components:   SARS Coronavirus 2 Ag Positive (*)    All other components within normal limits  PROTIME-INR  APTT  CBC  CBG MONITORING, ED  TROPONIN I (HIGH SENSITIVITY)  TROPONIN I (HIGH SENSITIVITY)   ____________________________________________   EKG  Interpreted by me Normal sinus rhythm rate of 96, normal axis and intervals.  Poor R wave progression.  Normal ST segments and T waves.  ____________________________________________    RADIOLOGY  CT HEAD WO CONTRAST  Result Date: 06/30/2021 CLINICAL DATA:  Posterior head  pain/trauma EXAM: CT HEAD WITHOUT CONTRAST TECHNIQUE: Contiguous axial images were obtained from the base of the skull through the vertex without intravenous contrast. COMPARISON:  09/21/2009 FINDINGS: Brain: No evidence of acute infarction, hemorrhage, hydrocephalus, extra-axial collection or mass lesion/mass effect. Vascular: No hyperdense vessel or unexpected calcification. Skull: Normal. Negative for fracture or focal lesion. Sinuses/Orbits: The visualized paranasal sinuses are essentially clear. The mastoid air cells are unopacified. Other: None. IMPRESSION: Normal head CT. Electronically Signed   By: Julian Hy M.D.   On: 06/30/2021 21:03    ____________________________________________   PROCEDURES Procedures  ____________________________________________    CLINICAL IMPRESSION / ASSESSMENT AND PLAN / ED COURSE  Medications ordered in the ED: Medications  sodium chloride 0.9 % bolus 1,000 mL (0 mLs Intravenous Stopped 07/01/21  0045)  ketorolac (TORADOL) 30 MG/ML injection 15 mg (15 mg Intravenous Given 06/30/21 2348)  ondansetron (ZOFRAN) injection 4 mg (4 mg Intravenous Given 06/30/21 2349)  pantoprazole (PROTONIX) injection 40 mg (40 mg Intravenous Given 06/30/21 2349)    Pertinent labs & imaging results that were available during my care of the patient were reviewed by me and considered in my medical decision making (see chart for details).  KENT BRAUNSCHWEIG was evaluated in Emergency Department on 07/01/2021 for the symptoms described in the history of present illness. She was evaluated in the context of the global COVID-19 pandemic, which necessitated consideration that the patient might be at risk for infection with the SARS-CoV-2 virus that causes COVID-19. Institutional protocols and algorithms that pertain to the evaluation of patients at risk for COVID-19 are in a state of rapid change based on information released by regulatory bodies including the CDC and federal and state  organizations. These policies and algorithms were followed during the patient's care in the ED.   Patient presents with multiple symptoms consistent with viral illness/influenza-like illness.  Rapid antigen COVID test is positive at the bedside during my examination.  Patient's not septic.  IV fluids and supportive care given and patient feels much improved, she is tolerating oral intake and stable for discharge home.  Due to hypertension and obesity, will prescribe Paxlovid.      ____________________________________________   FINAL CLINICAL IMPRESSION(S) / ED DIAGNOSES    Final diagnoses:  COVID-19 virus infection  Bad headache     ED Discharge Orders          Ordered    Nirmatrelvir & Ritonavir (PAXLOVID) 20 x 150 MG & 10 x 100MG  TBPK  2 times daily        07/01/21 0325    naproxen (NAPROSYN) 500 MG tablet  2 times daily with meals        07/01/21 0325    ondansetron (ZOFRAN ODT) 4 MG disintegrating tablet  Every 8 hours PRN        07/01/21 0325            Portions of this note were generated with dragon dictation software. Dictation errors may occur despite best attempts at proofreading.    Carrie Mew, MD 07/01/21 [phone]

## 2021-11-06 ENCOUNTER — Encounter: Payer: Self-pay | Admitting: Emergency Medicine

## 2021-11-06 ENCOUNTER — Other Ambulatory Visit: Payer: Self-pay

## 2021-11-06 DIAGNOSIS — J101 Influenza due to other identified influenza virus with other respiratory manifestations: Secondary | ICD-10-CM | POA: Insufficient documentation

## 2021-11-06 DIAGNOSIS — F17210 Nicotine dependence, cigarettes, uncomplicated: Secondary | ICD-10-CM | POA: Insufficient documentation

## 2021-11-06 DIAGNOSIS — Z20822 Contact with and (suspected) exposure to covid-19: Secondary | ICD-10-CM | POA: Insufficient documentation

## 2021-11-06 DIAGNOSIS — H9203 Otalgia, bilateral: Secondary | ICD-10-CM | POA: Insufficient documentation

## 2021-11-06 DIAGNOSIS — I10 Essential (primary) hypertension: Secondary | ICD-10-CM | POA: Insufficient documentation

## 2021-11-06 DIAGNOSIS — Z79899 Other long term (current) drug therapy: Secondary | ICD-10-CM | POA: Insufficient documentation

## 2021-11-06 LAB — RESP PANEL BY RT-PCR (FLU A&B, COVID) ARPGX2
Influenza A by PCR: POSITIVE — AB
Influenza B by PCR: NEGATIVE
SARS Coronavirus 2 by RT PCR: NEGATIVE

## 2021-11-06 NOTE — ED Triage Notes (Addendum)
Pt to triage via w/c with no distress noted; c/o generalized HA today; ibuprofen taken 2hrs PTA; denies any recent illness

## 2021-11-07 ENCOUNTER — Emergency Department
Admission: EM | Admit: 2021-11-07 | Discharge: 2021-11-07 | Disposition: A | Discharge status: Home / Self Care | Payer: Self-pay | Attending: Emergency Medicine | Admitting: Emergency Medicine

## 2021-11-07 DIAGNOSIS — J101 Influenza due to other identified influenza virus with other respiratory manifestations: Secondary | ICD-10-CM

## 2021-11-07 MED ORDER — OSELTAMIVIR PHOSPHATE 75 MG PO CAPS: 75.0000 mg | ORAL_CAPSULE | Freq: Two times a day (BID) | ORAL | 0 refills | Status: AC

## 2021-11-07 MED ORDER — KETOROLAC TROMETHAMINE 30 MG/ML IJ SOLN
30.0000 mg | Freq: Once | INTRAMUSCULAR | Status: AC
Start: 2021-11-07 — End: 2021-11-07
  Administered 2021-11-07: 30 mg via INTRAMUSCULAR
  Filled 2021-11-07: qty 1

## 2021-11-07 NOTE — Discharge Instructions (Signed)
1.  Start Tamiflu twice daily as directed. 2.  Alternate Tylenol and Ibuprofen every 4 hours as needed for fever greater than 100.4 F. 3.  Drink plenty of fluids daily. 4.  Return to the ER for worsening symptoms, persistent vomiting, difficulty breathing or other concerns.

## 2021-11-07 NOTE — ED Provider Notes (Signed)
[facility] Emergency Department Provider Note   ____________________________________________   Event Date/Time   First MD Initiated Contact with Patient 11/07/21 [phone]     (approximate)  I have reviewed the triage vital signs and the nursing notes.   HISTORY  Chief Complaint Headache    HPI Holly Ponce is a 45 y.o. female who presents to the ED from home with a chief complaint of headache, body aches, fever, cough and congestion.  Ibuprofen taken approximately 7 PM.  Denies chest pain, shortness of breath, abdominal pain, nausea, vomiting or diarrhea.     Past Medical History:  Diagnosis Date   Hypertension    Sleep apnea     Patient Active Problem List   Diagnosis Date Noted   Rhabdomyolysis 03/31/2016    Past Surgical History:  Procedure Laterality Date   ABDOMINAL HYSTERECTOMY      Prior to Admission medications   Medication Sig Start Date End Date Taking? Authorizing Provider  oseltamivir (TAMIFLU) 75 MG capsule Take 1 capsule (75 mg total) by mouth 2 (two) times daily. 11/07/21  Yes Paulette Blanch, MD  erythromycin ophthalmic ointment Place 1 application into the right eye 4 (four) times daily. For 5 days 04/16/21   Ward, Delice Bison, DO  ibuprofen (ADVIL,MOTRIN) 400 MG tablet Take 400 mg by mouth every 6 (six) hours as needed.    [provider]  Multiple Vitamin (MULTIVITAMIN) tablet Take 1 tablet by mouth daily.    [provider]  naproxen (NAPROSYN) 500 MG tablet Take 1 tablet (500 mg total) by mouth 2 (two) times daily with a meal. 07/01/21   Carrie Mew, MD  ondansetron (ZOFRAN ODT) 4 MG disintegrating tablet Take 1 tablet (4 mg total) by mouth every 6 (six) hours as needed for nausea or vomiting. 04/16/21   Ward, Delice Bison, DO  ondansetron (ZOFRAN ODT) 4 MG disintegrating tablet Take 1 tablet (4 mg total) by mouth every 8 (eight) hours as needed for nausea or vomiting. 07/01/21   Carrie Mew, MD   oxyCODONE-acetaminophen (PERCOCET) 5-325 MG tablet Take 1 tablet by mouth every 4 (four) hours as needed for severe pain. 04/16/21 04/16/22  Ward, Delice Bison, DO    Allergies Tramadol and Vicodin [hydrocodone-acetaminophen]  No family history on file.  Social History Social History   Tobacco Use   Smoking status: Every Day    Types: Cigarettes   Smokeless tobacco: Never  Vaping Use   Vaping Use: Never used  Substance Use Topics   Alcohol use: No    Review of Systems  Constitutional: Positive for fever and body aches.   Eyes: No visual changes. ENT: Positive for congestion.  No sore throat. Cardiovascular: Denies chest pain. Respiratory: Positive for cough.  Denies shortness of breath. Gastrointestinal: No abdominal pain.  No nausea, no vomiting.  No diarrhea.  No constipation. Genitourinary: Negative for dysuria. Musculoskeletal: Negative for back pain. Skin: Negative for rash. Neurological: Positive for headache.  Negative for focal weakness or numbness.   ____________________________________________   PHYSICAL EXAM:  VITAL SIGNS: ED Triage Vitals  Enc Vitals Group     BP 11/06/21 2109 (!) 142/94     Pulse Rate 11/06/21 2109 (!) 110     Resp 11/06/21 2109 18     Temp 11/06/21 2109 (!) 100.9 F (38.3 C)     Temp Source 11/06/21 2109 Oral     SpO2 11/06/21 2109 95 %     Weight 11/06/21 2107 276 lb (125.2  kg)     Height 11/06/21 2107 5\' 5"  (1.651 m)     Head Circumference --      Peak Flow --      Pain Score 11/06/21 2107 7     Pain Loc --      Pain Edu? --      Excl. in [place]? --     Constitutional: Alert and oriented. Well appearing and in mild acute distress. Eyes: Conjunctivae are normal. PERRL. EOMI. Head: Atraumatic. Ears: Bilateral TM dullness. Nose: Congestion/rhinnorhea. Mouth/Throat: Mucous membranes are moist.  Oropharynx non-erythematous. Neck: No stridor.  Supple neck without meningismus. Hematological/Lymphatic/Immunilogical: No cervical  lymphadenopathy. Cardiovascular: Normal rate, regular rhythm. Grossly normal heart sounds.  Good peripheral circulation. Respiratory: Normal respiratory effort.  No retractions. Lungs CTAB. Gastrointestinal: Soft and nontender. No distention. No abdominal bruits. No CVA tenderness. Musculoskeletal: No lower extremity tenderness nor edema.  No joint effusions. Neurologic:  Normal speech and language. No gross focal neurologic deficits are appreciated. No gait instability. Skin:  Skin is warm, dry and intact. No rash noted.  No petechiae. Psychiatric: Mood and affect are normal. Speech and behavior are normal.  ____________________________________________   LABS (all labs ordered are listed, but only abnormal results are displayed)  Labs Reviewed  RESP PANEL BY RT-PCR (FLU A&B, COVID) ARPGX2 - Abnormal; Notable for the following components:      Result Value   Influenza A by PCR POSITIVE (*)    All other components within normal limits   ____________________________________________  EKG  None ____________________________________________  RADIOLOGY I, Samarra Ridgely J, personally viewed and evaluated these images (plain radiographs) as part of my medical decision making, as well as reviewing the written report by the radiologist.  ED MD interpretation: None  Official radiology report(s): No results found.  ____________________________________________   PROCEDURES  Procedure(s) performed (including Critical Care):  Procedures   ____________________________________________   INITIAL IMPRESSION / ASSESSMENT AND PLAN / ED COURSE  As part of my medical decision making, I reviewed the following data within the [facility] notes reviewed and incorporated, Labs reviewed, and Notes from prior ED visits     45 year old female presenting with a 1 day history of fever, body aches, headache and congestion.  Frontal diagnosis includes but is not limited to viral  process such as COVID-19, influenza, community-acquired pneumonia, bronchitis, etc.  Patient is positive for influenza A.  She is now afebrile with improved tachycardia.  No evidence of tachypnea nor hypoxia.  Neck is supple without evidence of meningismus.  There are no focal neurological deficits on examination.  She is within the window for Tamiflu.  Will administer IM Toradol for body aches, prescription for Tamiflu sent to her pharmacy.  Encouraged plenty of fluids daily.  Strict return precautions given.  Patient verbalizes understanding and agrees with plan of care.      ____________________________________________   FINAL CLINICAL IMPRESSION(S) / ED DIAGNOSES  Final diagnoses:  Influenza A     ED Discharge Orders          Ordered    oseltamivir (TAMIFLU) 75 MG capsule  2 times daily        11/07/21 0209             Note:  This document was prepared using Dragon voice recognition software and may include unintentional dictation errors.    Paulette Blanch, MD 11/07/21 [phone]

## 2021-11-24 ENCOUNTER — Other Ambulatory Visit: Payer: Self-pay

## 2021-11-24 ENCOUNTER — Emergency Department
Admission: EM | Admit: 2021-11-24 | Discharge: 2021-11-24 | Disposition: A | Discharge status: Home / Self Care | Payer: Self-pay | Attending: Emergency Medicine | Admitting: Emergency Medicine

## 2021-11-24 ENCOUNTER — Encounter: Payer: Self-pay | Admitting: Emergency Medicine

## 2021-11-24 DIAGNOSIS — H6692 Otitis media, unspecified, left ear: Secondary | ICD-10-CM | POA: Insufficient documentation

## 2021-11-24 DIAGNOSIS — H6690 Otitis media, unspecified, unspecified ear: Secondary | ICD-10-CM

## 2021-11-24 DIAGNOSIS — H60502 Unspecified acute noninfective otitis externa, left ear: Secondary | ICD-10-CM | POA: Insufficient documentation

## 2021-11-24 DIAGNOSIS — I10 Essential (primary) hypertension: Secondary | ICD-10-CM | POA: Insufficient documentation

## 2021-11-24 DIAGNOSIS — F17210 Nicotine dependence, cigarettes, uncomplicated: Secondary | ICD-10-CM | POA: Insufficient documentation

## 2021-11-24 MED ORDER — IBUPROFEN 600 MG PO TABS
600.0000 mg | ORAL_TABLET | Freq: Once | ORAL | Status: AC
  Administered 2021-11-24: 600 mg via ORAL
  Filled 2021-11-24: qty 1

## 2021-11-24 MED ORDER — AMOXICILLIN-POT CLAVULANATE 875-125 MG PO TABS: 1.0000 | ORAL_TABLET | Freq: Two times a day (BID) | ORAL | 0 refills | Status: AC

## 2021-11-24 MED ORDER — NEOMYCIN-POLYMYXIN-HC 3.5-10000-1 OT SOLN: 3.0000 [drp] | Freq: Four times a day (QID) | OTIC | 0 refills | Status: AC

## 2021-11-24 NOTE — Discharge Instructions (Addendum)
You have been diagnosed with an internal and external ear infection.  Please complete entire course of oral antibiotics.  You will also be given course of eardrops that you should use as directed.  Be sure initially to attempt and place 2 to 4 drops in the left ear 4 times a day.  However with the swelling your ear canal may only hold 1 drop initially.  Please follow-up with your primary care provider as needed or if symptoms persist or worsen.

## 2021-11-24 NOTE — ED Triage Notes (Signed)
Pt reports pain to left ear since yesterday. Pt states some drainage as well and thinks it is infected.

## 2021-11-24 NOTE — ED Notes (Signed)
Pt to ED c/o L ear pain and swelling that started yesterday. Pt states had influenza about 2 weeks ago. Ambulatory to room.

## 2021-11-24 NOTE — ED Provider Notes (Signed)
[facility] Emergency Department Provider Note   ____________________________________________   Event Date/Time   First MD Initiated Contact with Patient 11/24/21 1455     (approximate)  I have reviewed the triage vital signs and the nursing notes.   HISTORY  Chief Complaint Otalgia    HPI Holly Ponce is a 45 y.o. female patient presents to the emergency room with complaints of left ear pain for the past 2 days.  Patient reports that 2 weeks ago she did have the flu but although symptoms have resolved.  Patient reports that the left ear pain is a 9 out of 10 and the nature is sharp, stabbing, shooting.  Patient reports she does have a throbbing behind her left ear and has noted swelling behind her ear as well.  Patient reports that she has had drainage coming from the left ear for the last 12 hours.  Patient denies any cough, runny nose, sore throat, fever, pain with swallowing/chewing.  Patient does have a history of otitis media as a child.  Past Medical History:  Diagnosis Date   Hypertension    Sleep apnea     Patient Active Problem List   Diagnosis Date Noted   Rhabdomyolysis 03/31/2016    Past Surgical History:  Procedure Laterality Date   ABDOMINAL HYSTERECTOMY      Prior to Admission medications   Medication Sig Start Date End Date Taking? Authorizing Provider  amoxicillin-clavulanate (AUGMENTIN) 875-125 MG tablet Take 1 tablet by mouth 2 (two) times daily for 10 days. 11/24/21 12/04/21 Yes Willaim Rayas, NP  neomycin-polymyxin-hydrocortisone (CORTISPORIN) OTIC solution Place 3 drops into the left ear 4 (four) times daily for 7 days. 11/24/21 12/01/21 Yes Willaim Rayas, NP  erythromycin ophthalmic ointment Place 1 application into the right eye 4 (four) times daily. For 5 days 04/16/21   Ward, Delice Bison, DO  ibuprofen (ADVIL,MOTRIN) 400 MG tablet Take 400 mg by mouth every 6 (six) hours as needed.    [provider]  Multiple  Vitamin (MULTIVITAMIN) tablet Take 1 tablet by mouth daily.    [provider]  naproxen (NAPROSYN) 500 MG tablet Take 1 tablet (500 mg total) by mouth 2 (two) times daily with a meal. 07/01/21   Carrie Mew, MD  ondansetron (ZOFRAN ODT) 4 MG disintegrating tablet Take 1 tablet (4 mg total) by mouth every 6 (six) hours as needed for nausea or vomiting. 04/16/21   Ward, Delice Bison, DO  ondansetron (ZOFRAN ODT) 4 MG disintegrating tablet Take 1 tablet (4 mg total) by mouth every 8 (eight) hours as needed for nausea or vomiting. 07/01/21   Carrie Mew, MD  oseltamivir (TAMIFLU) 75 MG capsule Take 1 capsule (75 mg total) by mouth 2 (two) times daily. 11/07/21   Paulette Blanch, MD  oxyCODONE-acetaminophen (PERCOCET) 5-325 MG tablet Take 1 tablet by mouth every 4 (four) hours as needed for severe pain. 04/16/21 04/16/22  Ward, Delice Bison, DO    Allergies Tramadol and Vicodin [hydrocodone-acetaminophen]  No family history on file.  Social History Social History   Tobacco Use   Smoking status: Every Day    Types: Cigarettes   Smokeless tobacco: Never  Vaping Use   Vaping Use: Never used  Substance Use Topics   Alcohol use: No    Review of Systems  Constitutional: No fever/chills Eyes: No visual changes. ENT: No sore throat.  Positive for left ear drainage and pain. Cardiovascular: Denies chest pain. Respiratory: Denies shortness of breath.  Gastrointestinal: No abdominal pain.  No nausea, no vomiting.  No diarrhea.  No constipation. Genitourinary: Negative for dysuria. Musculoskeletal: Negative for back pain. Skin: Negative for rash. Neurological: Negative for headaches, focal weakness or numbness.   ____________________________________________   PHYSICAL EXAM:  VITAL SIGNS: ED Triage Vitals  Enc Vitals Group     BP 11/24/21 1121 (!) 162/103     Pulse Rate 11/24/21 1121 89     Resp 11/24/21 1121 20     Temp 11/24/21 1121 99.1 F (37.3 C)     Temp Source 11/24/21  1121 Oral     SpO2 11/24/21 1121 98 %     Weight 11/24/21 1115 286 lb 9.6 oz (130 kg)     Height 11/24/21 1115 5\' 5"  (1.651 m)     Head Circumference --      Peak Flow --      Pain Score 11/24/21 1115 6     Pain Loc --      Pain Edu? --      Excl. in [place]? --     Constitutional: Alert and oriented. Well appearing and in no acute distress. Eyes: Conjunctivae are normal. PERRL. EOMI. Head: Atraumatic. Nose: No congestion/rhinnorhea. Mouth/Throat: Mucous membranes are moist.  Oropharynx non-erythematous.  Patient has drainage to left ear that appears to be coming from a small pimple to the external ear canal.  Patient also has external ear canal is almost completely swollen closed. Neck: No stridor.  Patient does have some swelling noted behind the left ear into the left side of neck. Cardiovascular: Normal rate, regular rhythm. Grossly normal heart sounds.  Good peripheral circulation.  Patient is hypertensive and should follow-up with her primary care doctor concerning this. Respiratory: Normal respiratory effort.  No retractions. Lungs CTAB. Gastrointestinal: Soft and nontender. No distention. No abdominal bruits. No CVA tenderness. Musculoskeletal: No lower extremity tenderness nor edema.  No joint effusions. Neurologic:  Normal speech and language. No gross focal neurologic deficits are appreciated. No gait instability. Skin:  Skin is warm, dry and intact. No rash noted. Psychiatric: Mood and affect are normal. Speech and behavior are normal.  ____________________________________________   LABS (all labs ordered are listed, but only abnormal results are displayed)  Labs Reviewed - No data to display ____________________________________________  EKG   ____________________________________________  RADIOLOGY  ED MD interpretation:    Official radiology report(s): No results found.  ____________________________________________   PROCEDURES  Procedure(s) performed:  None  Procedures  Critical Care performed: No  ____________________________________________   INITIAL IMPRESSION / ASSESSMENT AND PLAN / ED COURSE     45 year old female presents to the emergency room with complaint of left ear pain x2 days.  Patient reports that she did have the flu 2 weeks ago but although symptoms have resolved.  Patient reports that her left ear has been draining pus.  On exam she is noted to have small pimple to the external ear canal.  Her ear canal is almost completely swollen closed and erythematous.  Please see HPI for full explanation. Patient will be treated with oral antibiotics, Augmentin, for the next 10 days.  She will also be given Cortisporin otic drops to use 4 times a day for the next 7 days. She is to follow-up with her primary care doctor if symptoms persist or worsen concerning her left ear. She should schedule follow-up with her primary care doctor at her earliest convenience to discuss her hypertension. She is slightly hypertensive today.  However she does deny chest  pain, shortness of breath, dyspnea on exertion, dizziness, headaches. Patient will be discharged home in stable condition at this time. As patient was being discharged by nurse, she requested ibuprofen for the pain prior to discharge.  I have placed an order for ibuprofen 600 mg to be given x1 while here in the emergency room.  She will then be discharged home.      ____________________________________________   FINAL CLINICAL IMPRESSION(S) / ED DIAGNOSES  Final diagnoses:  Acute otitis externa of left ear, unspecified type  Acute otitis media, unspecified otitis media type     ED Discharge Orders          Ordered    amoxicillin-clavulanate (AUGMENTIN) 875-125 MG tablet  2 times daily        11/24/21 1507    neomycin-polymyxin-hydrocortisone (CORTISPORIN) OTIC solution  4 times daily        11/24/21 1507             Note:  This document was prepared using Dragon  voice recognition software and may include unintentional dictation errors.     Willaim Rayas, NP 11/24/21 1514    Blake Divine, MD 11/24/21 Sharilyn Sites

## 2022-05-21 ENCOUNTER — Emergency Department: Payer: Self-pay

## 2022-05-21 ENCOUNTER — Emergency Department
Admission: EM | Admit: 2022-05-21 | Discharge: 2022-05-21 | Disposition: A | Discharge status: Home / Self Care | Payer: Self-pay | Attending: Emergency Medicine | Admitting: Emergency Medicine

## 2022-05-21 ENCOUNTER — Encounter: Payer: Self-pay | Admitting: Emergency Medicine

## 2022-05-21 DIAGNOSIS — R1110 Vomiting, unspecified: Secondary | ICD-10-CM | POA: Insufficient documentation

## 2022-05-21 DIAGNOSIS — I10 Essential (primary) hypertension: Secondary | ICD-10-CM | POA: Insufficient documentation

## 2022-05-21 DIAGNOSIS — R0989 Other specified symptoms and signs involving the circulatory and respiratory systems: Secondary | ICD-10-CM | POA: Insufficient documentation

## 2022-05-21 DIAGNOSIS — R051 Acute cough: Secondary | ICD-10-CM | POA: Insufficient documentation

## 2022-05-21 LAB — COMPREHENSIVE METABOLIC PANEL
ALT: 18 U/L (ref 0–44)
AST: 20 U/L (ref 15–41)
Albumin: 3.7 g/dL (ref 3.5–5.0)
Alkaline Phosphatase: 67 U/L (ref 38–126)
Anion gap: 6 (ref 5–15)
BUN: 14 mg/dL (ref 6–20)
CO2: 25 mmol/L (ref 22–32)
Calcium: 8.7 mg/dL — ABNORMAL LOW (ref 8.9–10.3)
Chloride: 107 mmol/L (ref 98–111)
Creatinine, Ser: 0.64 mg/dL (ref 0.44–1.00)
GFR, Estimated: >60 mL/min (ref >60)
Glucose, Bld: 120 mg/dL — ABNORMAL HIGH (ref 70–99)
Potassium: 3 mmol/L — ABNORMAL LOW (ref 3.5–5.1)
Sodium: 138 mmol/L (ref 135–145)
Total Bilirubin: 0.4 mg/dL (ref 0.3–1.2)
Total Protein: 6.8 g/dL (ref 6.5–8.1)

## 2022-05-21 LAB — CBC WITH DIFFERENTIAL/PLATELET
Abs Immature Granulocytes: 0.02 10*3/uL (ref 0.00–0.07)
Basophils Absolute: 0 10*3/uL (ref 0.0–0.1)
Basophils Relative: 0 %
Eosinophils Absolute: 0.1 10*3/uL (ref 0.0–0.5)
Eosinophils Relative: 2 %
HCT: 41.3 % (ref 36.0–46.0)
Hemoglobin: 13.8 g/dL (ref 12.0–15.0)
Immature Granulocytes: 0 %
Lymphocytes Relative: 19 %
Lymphs Abs: 1.3 10*3/uL (ref 0.7–4.0)
MCH: 30.7 pg (ref 26.0–34.0)
MCHC: 33.4 g/dL (ref 30.0–36.0)
MCV: 92 fL (ref 80.0–100.0)
Monocytes Absolute: 0.6 10*3/uL (ref 0.1–1.0)
Monocytes Relative: 8 %
Neutro Abs: 4.9 10*3/uL (ref 1.7–7.7)
Neutrophils Relative %: 71 %
Platelets: 241 10*3/uL (ref 150–400)
RBC: 4.49 MIL/uL (ref 3.87–5.11)
RDW: 13.5 % (ref 11.5–15.5)
WBC: 6.9 10*3/uL (ref 4.0–10.5)
nRBC: 0 % (ref 0.0–0.2)

## 2022-05-21 MED ORDER — AMOXICILLIN-POT CLAVULANATE 875-125 MG PO TABS
1.0000 | ORAL_TABLET | Freq: Once | ORAL | Status: AC
  Administered 2022-05-21: 1 via ORAL
  Filled 2022-05-21: qty 1

## 2022-05-21 MED ORDER — ALUM & MAG HYDROXIDE-SIMETH 200-200-20 MG/5ML PO SUSP
30.0000 mL | Freq: Once | ORAL | Status: AC
  Administered 2022-05-21: 30 mL via ORAL
  Filled 2022-05-21: qty 30

## 2022-05-21 MED ORDER — ONDANSETRON HCL 4 MG/2ML IJ SOLN
4.0000 mg | Freq: Once | INTRAMUSCULAR | Status: AC
  Administered 2022-05-21: 4 mg via INTRAVENOUS
  Filled 2022-05-21: qty 2

## 2022-05-21 MED ORDER — FAMOTIDINE IN NACL 20-0.9 MG/50ML-% IV SOLN
20.0000 mg | Freq: Once | INTRAVENOUS | Status: AC
  Administered 2022-05-21: 20 mg via INTRAVENOUS
  Filled 2022-05-21: qty 50

## 2022-05-21 MED ORDER — GLUCAGON HCL RDNA (DIAGNOSTIC) 1 MG IJ SOLR
1.0000 mg | Freq: Once | INTRAMUSCULAR | Status: AC
  Administered 2022-05-21: 1 mg via INTRAVENOUS
  Filled 2022-05-21 (×2): qty 1

## 2022-05-21 MED ORDER — MAALOX MAX 400-400-40 MG/5ML PO SUSP
5.0000 mL | Freq: Four times a day (QID) | ORAL | 0 refills | Status: DC | PRN
Start: 2022-05-21 — End: 2022-05-21

## 2022-05-21 MED ORDER — ONDANSETRON 4 MG PO TBDP
4.0000 mg | ORAL_TABLET | Freq: Three times a day (TID) | ORAL | 0 refills | Status: AC | PRN
Start: 2022-05-21 — End: ?

## 2022-05-21 MED ORDER — AMOXICILLIN-POT CLAVULANATE 875-125 MG PO TABS: 1.0000 | ORAL_TABLET | Freq: Two times a day (BID) | ORAL | 0 refills | Status: AC

## 2022-05-21 MED ORDER — MAALOX MAX 400-400-40 MG/5ML PO SUSP: 5.0000 mL | Freq: Four times a day (QID) | ORAL | 0 refills | Status: AC | PRN

## 2022-05-21 NOTE — ED Provider Notes (Signed)
[facility] Provider Note    Event Date/Time   First MD Initiated Contact with Patient 05/21/22 [phone]     (approximate)   History   Dysphagia   HPI  Holly Ponce is a 46 y.o. female with history of OSA, hypertension, obesity who presents for evaluation of cough and vomiting.  Patient reports that she was at a cookout 2 days ago when she choked on a piece of meat.  Since then she has had the sensation that something stuck in her throat.  She keeps coughing to try to get it out sometimes she coughs so hard that she makes herself vomit.  She has had no shortness of breath, no difficulty swallowing, has been able to keep solids and fluids down.  No fever or chills, no chest pain.     Past Medical History:  Diagnosis Date   Hypertension    Sleep apnea     Past Surgical History:  Procedure Laterality Date   ABDOMINAL HYSTERECTOMY       Physical Exam   Triage Vital Signs: ED Triage Vitals  Enc Vitals Group     BP 05/21/22 0133 (!) 163/96     Pulse Rate 05/21/22 0133 91     Resp 05/21/22 0133 18     Temp 05/21/22 0133 98.7 F (37.1 C)     Temp Source 05/21/22 0133 Oral     SpO2 05/21/22 0133 98 %     Weight 05/21/22 0131 276 lb (125.2 kg)     Height 05/21/22 0131 '5\' 5"'$  (1.651 m)     Head Circumference --      Peak Flow --      Pain Score --      Pain Loc --      Pain Edu? --      Excl. in [place]? --     Most recent vital signs: Vitals:   05/21/22 0133  BP: (!) 163/96  Pulse: 91  Resp: 18  Temp: 98.7 F (37.1 C)  SpO2: 98%    Constitutional: Alert and oriented. Well appearing and in no apparent distress. HEENT:      Head: Normocephalic and atraumatic.         Eyes: Conjunctivae are normal. Sclera is non-icteric.       Mouth/Throat: Mucous membranes are moist.       Neck: Supple with no signs of meningismus. Cardiovascular: Regular rate and rhythm. No murmurs, gallops, or rubs. 2+ symmetrical distal pulses are present in all  extremities.  Respiratory: Normal respiratory effort. Lungs are clear to auscultation bilaterally.  Gastrointestinal: Soft, non tender, and non distended with positive bowel sounds. No rebound or guarding. Genitourinary: No CVA tenderness. Musculoskeletal:  No edema, cyanosis, or erythema of extremities. Neurologic: Normal speech and language. Face is symmetric. Moving all extremities. No gross focal neurologic deficits are appreciated. Skin: Skin is warm, dry and intact. No rash noted. Psychiatric: Mood and affect are normal. Speech and behavior are normal.  ED Results / Procedures / Treatments   Labs (all labs ordered are listed, but only abnormal results are displayed) Labs Reviewed  COMPREHENSIVE METABOLIC PANEL - Abnormal; Notable for the following components:      Result Value   Potassium 3.0 (*)    Glucose, Bld 120 (*)    Calcium 8.7 (*)    All other components within normal limits  CBC WITH DIFFERENTIAL/PLATELET     EKG  none   RADIOLOGY I, Rudene Re, attending MD,  have personally viewed and interpreted the images obtained during this visit as below:  Chest x-ray negative  X-ray of the neck negative ___________________________________________________ Interpretation by Radiologist:  DG Neck Soft Tissue  Result Date: 05/21/2022 CLINICAL DATA:  Cough EXAM: NECK SOFT TISSUES - 1+ VIEW COMPARISON:  None Available. FINDINGS: Seven cervical segments are well visualized. No soft tissue abnormality is seen. Epiglottis and aryepiglottic folds are within normal limits. No other focal abnormality is seen. IMPRESSION: No acute abnormality noted. Electronically Signed   By: Inez Catalina M.D.   On: 05/21/2022 02:28   DG Chest 2 View  Result Date: 05/21/2022 CLINICAL DATA:  Cough for 2 days EXAM: CHEST - 2 VIEW COMPARISON:  11/09/2018 FINDINGS: Cardiac shadow is within normal limits. Mild central vascular congestion is noted without edema. No sizable effusion is seen. No bony  abnormality is noted. IMPRESSION: Mild increased central vascular congestion Electronically Signed   By: Inez Catalina M.D.   On: 05/21/2022 02:27       PROCEDURES:  Critical Care performed: No  Procedures    IMPRESSION / MDM / ASSESSMENT AND PLAN / ED COURSE  I reviewed the triage vital signs and the nursing notes.   46 y.o. female with history of OSA, hypertension, obesity who presents for evaluation of cough and vomiting after choking on a piece of meat 2 days ago and having the persistent sensation that there is something in the back of her throat.  On exam she is well-appearing in no distress with normal vital signs, normal work of breathing normal sats, oropharynx is clear, no angioedema, no stridor, airways patent.  Patient is tolerating liquids and solids.  Ddx: Globus pallidus versus pharyngeal irritation versus esophageal food bolus versus aspiration pneumonia or pneumonitis   Plan: CXR, XR neck, CBC and CMP.  We will give IV Zofran, IV glucagon, IV Pepcid, and Maalox   MEDICATIONS GIVEN IN ED: Medications  amoxicillin-clavulanate (AUGMENTIN) 875-125 MG per tablet 1 tablet (has no administration in time range)  ondansetron (ZOFRAN) injection 4 mg (4 mg Intravenous Given 05/21/22 0150)  glucagon (human recombinant) (GLUCAGEN) injection 1 mg (1 mg Intravenous Given 05/21/22 0202)  famotidine (PEPCID) IVPB 20 mg premix (0 mg Intravenous Stopped 05/21/22 0224)  alum & mag hydroxide-simeth (MAALOX/MYLANTA) 200-200-20 MG/5ML suspension 30 mL (30 mLs Oral Given 05/21/22 0150)     ED COURSE: Patient feels improved after above cocktail.  Remains stable and able to tolerating fluids and solids.  Normal work of breathing normal sats.  X-rays are negative.  Due to choking episode we will start patient on Augmentin for possible aspiration event.  We will also give a prescription for Maalox and Zofran for home.  Recommended close follow-up with primary care doctor and discussed my standard  return precautions.  Referral to GI has been done for endoscopy if symptoms do not improve in the next couple of days   Consults: none   EMR reviewed including records from last visit with her primary care doctor from September 2021 for hypertension    FINAL CLINICAL IMPRESSION(S) / ED DIAGNOSES   Final diagnoses:  Globus pharyngeus  Acute cough  Vomiting, unspecified vomiting type, unspecified whether nausea present     Rx / DC Orders   ED Discharge Orders          Ordered    amoxicillin-clavulanate (AUGMENTIN) 875-125 MG tablet  2 times daily        05/21/22 0451    ondansetron (ZOFRAN-ODT) 4 MG disintegrating tablet  Every 8 hours PRN        05/21/22 0451    alum & mag hydroxide-simeth (MAALOX MAX) 585-277-82 MG/5ML suspension  Every 6 hours PRN,   Status:  Discontinued        05/21/22 0451    alum & mag hydroxide-simeth (MAALOX MAX) 423-536-14 MG/5ML suspension  Every 6 hours PRN        05/21/22 0452    Ambulatory referral to Gastroenterology        05/21/22 0456             Note:  This document was prepared using Dragon voice recognition software and may include unintentional dictation errors.   Please note:  Patient was evaluated in Emergency Department today for the symptoms described in the history of present illness. Patient was evaluated in the context of the global COVID-19 pandemic, which necessitated consideration that the patient might be at risk for infection with the SARS-CoV-2 virus that causes COVID-19. Institutional protocols and algorithms that pertain to the evaluation of patients at risk for COVID-19 are in a state of rapid change based on information released by regulatory bodies including the CDC and federal and state organizations. These policies and algorithms were followed during the patient's care in the ED.  Some ED evaluations and interventions may be delayed as a result of limited staffing during the pandemic.       Alfred Levins, [place],  MD 05/21/22 [phone]

## 2022-05-21 NOTE — ED Triage Notes (Addendum)
Pt presents via POV with complaints of dysphagia for the last two days. She notes she has been vomiting for the last two days due to this "odd sensation".  Pt has been consuming OTC cough medication to help suppress her urge to cough and vomit. Airway patent - respirations equal and unlabored.

## 2022-06-04 NOTE — Congregational Nurse Program (Signed)
  Dept: [phone]   Congregational Nurse Program Note  Date of Encounter: 06/04/2022  Past Medical History: Past Medical History:  Diagnosis Date   Hypertension    Sleep apnea     Encounter Details:  CNP Questionnaire - 06/04/22 1300       Questionnaire   Do you give verbal consent to treat you today? Yes    Location Patient Served  Not Applicable    Visit Setting Church or Organization    Patient Status Unknown    Insurance Uninsured ([employer]s/Self-Pay)    Insurance Referral Affordable Care (ACA);Medicaid    Medication N/A    Medical Provider Yes    Screening Referrals N/A    Medical Referral Non-[facility] PCP/Clinic    Medical Appointment Made N/A    Food Have Food Insecurities    Transportation Need transportation assistance    Housing/Utilities No permanent housing    Interpersonal Safety N/A    Intervention Blood pressure;Educate;Navigate Healthcare System    ED Visit Averted N/A    Life-Saving Intervention Made N/A            Client into nurse only clinic at food [facility] requesting assistance receiving dental care and BP check. Initial visit. Co re HIPAA. Co re [facility] dental clinic and charitable dental services. Client to call [facility] to attempt to access care through lottery. States needs fillings on upper right and upper left. Filling came out a few months ago so has painful hole in teeth. Has no income. Ex husband assists with costs that arrise day to day; client doesn't have permanent housing so is currently staying at a friend's house. [employer]. BP check 150/110 with recheck in 5 minutes 160/108. States has 2 BP meds but didn't take HCTZ today as it was recently prescribed and she doesn't feel well when she takes it. Co to follow up with MD. PCP is [place], [place]. Has [facility]. Follow up in this clinic prn re referrals. [id number],LA

## 2022-12-20 IMAGING — CR DG CHEST 2V
2 series · 2 of 2 positions shown · non-contrast
Comparison: 11/09/2018

CLINICAL DATA: Cough for 2 days

EXAM:
CHEST - 2 VIEW

[chest lat]
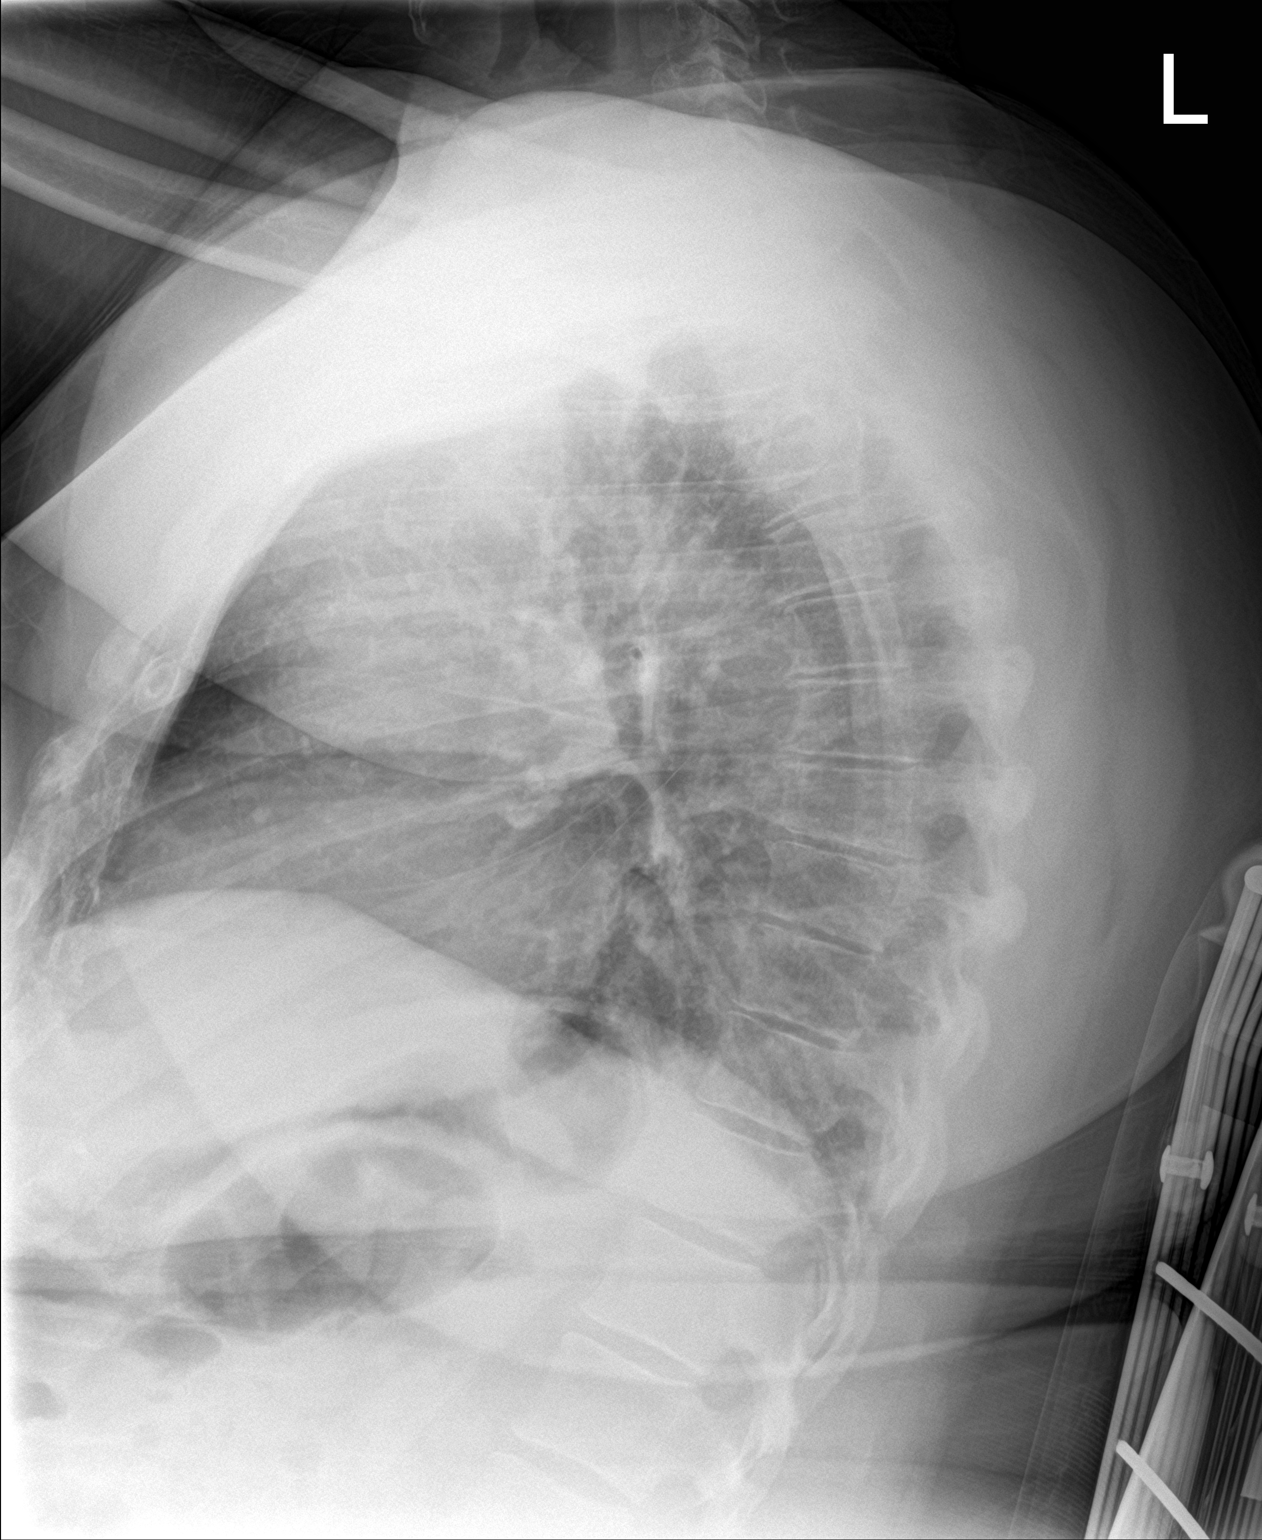

[chest ap]
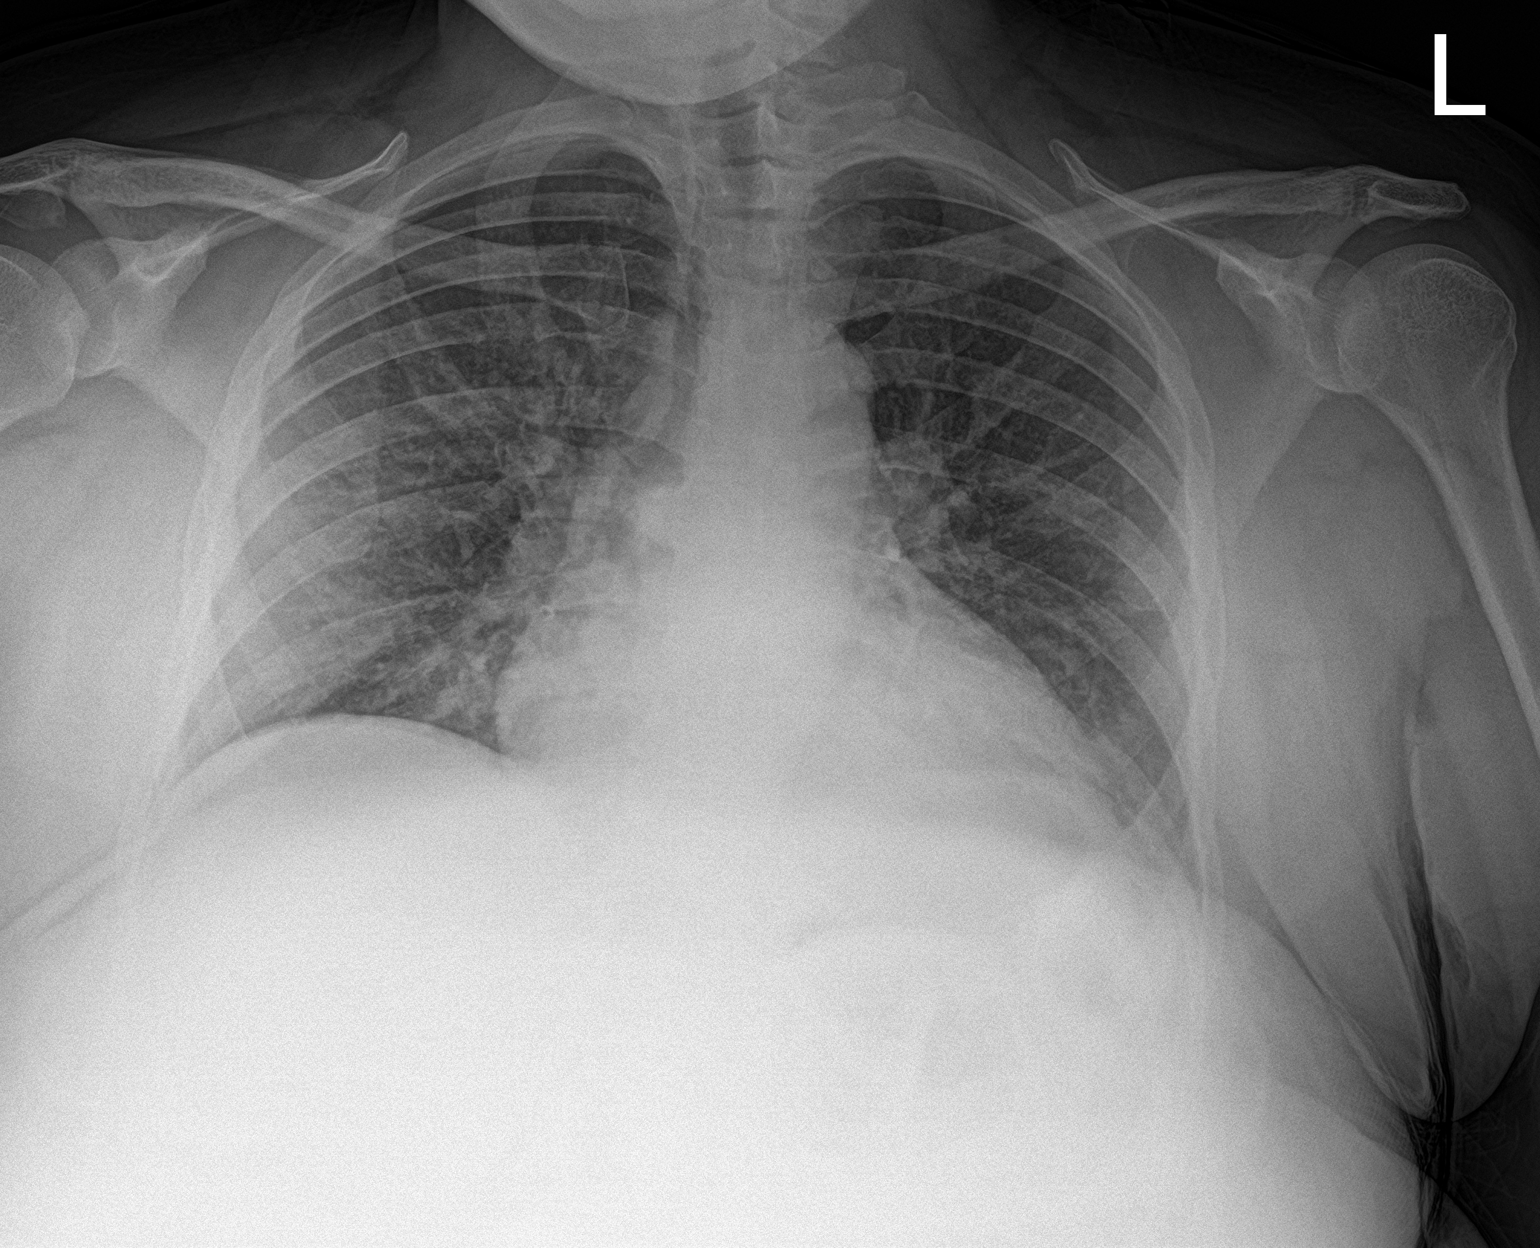

[2 of 2 positions shown; findings below may reference images not displayed]

FINDINGS: Cardiac shadow is within normal limits. Mild central vascular
congestion is noted without edema. No sizable effusion is seen. No
bony abnormality is noted.
IMPRESSION: Mild increased central vascular congestion

## 2022-12-20 IMAGING — CR DG NECK SOFT TISSUE
2 series · 2 of 2 positions shown · non-contrast
Comparison: None Available.

CLINICAL DATA: Cough

EXAM:
NECK SOFT TISSUES - 1+ VIEW

[neck lat]
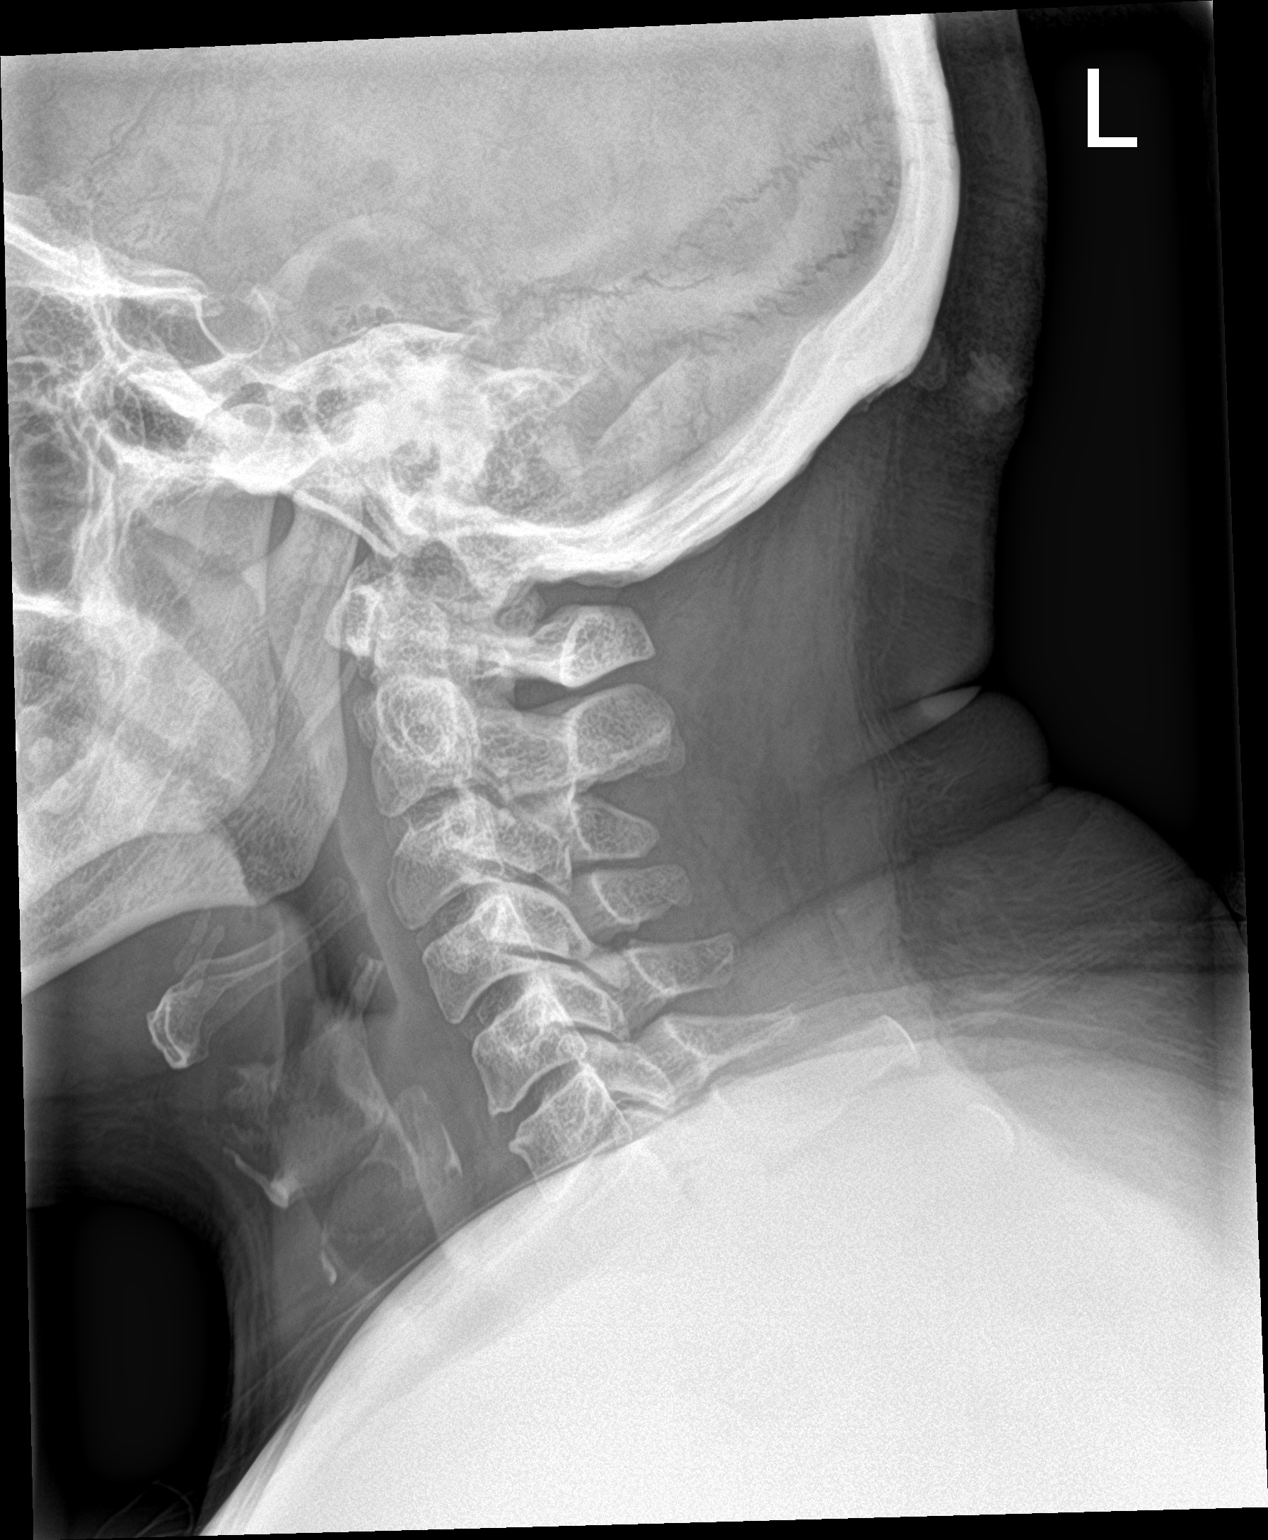

[neck ap]
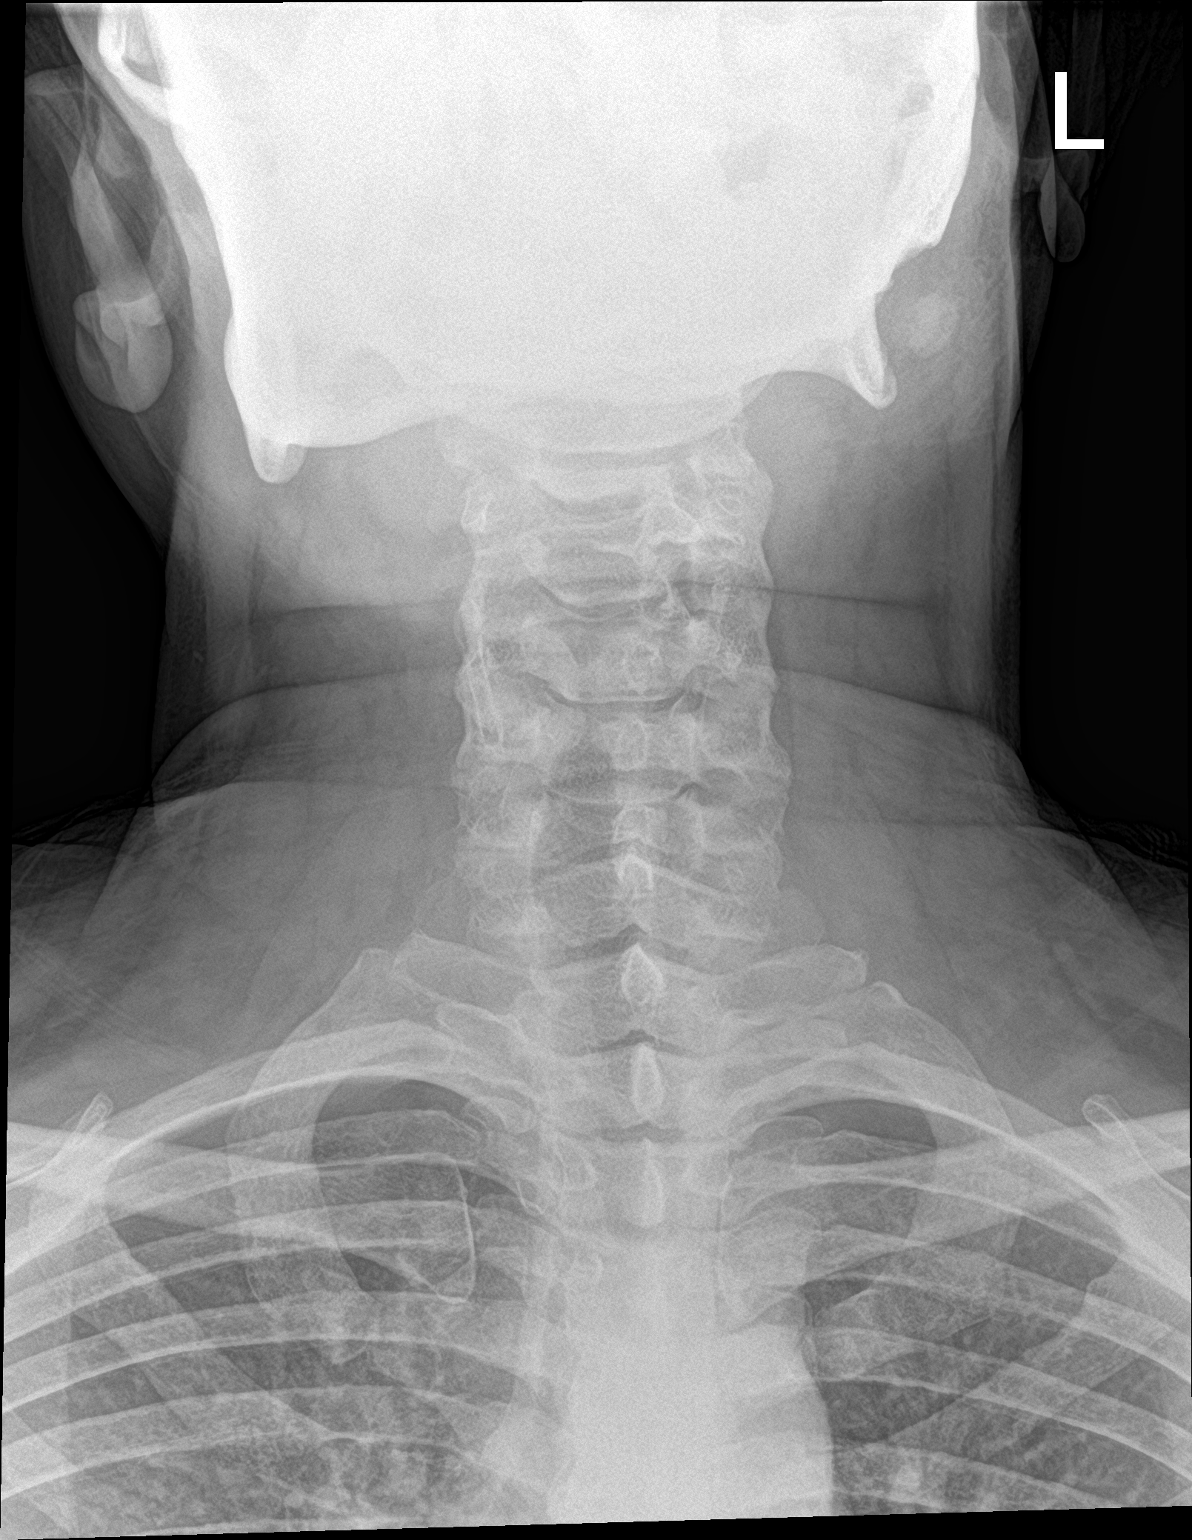

[2 of 2 positions shown; findings below may reference images not displayed]

FINDINGS: Seven cervical segments are well visualized. No soft tissue
abnormality is seen. Epiglottis and aryepiglottic folds are within
normal limits. No other focal abnormality is seen.
IMPRESSION: No acute abnormality noted.

## 2023-05-18 ENCOUNTER — Ambulatory Visit (LOCAL_COMMUNITY_HEALTH_CENTER): Payer: Self-pay

## 2023-05-18 DIAGNOSIS — Z111 Encounter for screening for respiratory tuberculosis: Secondary | ICD-10-CM

## 2023-05-18 NOTE — Progress Notes (Signed)
PPD placement. Cherlynn Polo, RN

## 2023-05-21 ENCOUNTER — Other Ambulatory Visit: Payer: Medicaid Other

## 2024-04-04 ENCOUNTER — Emergency Department
Admission: EM | Admit: 2024-04-04 | Discharge: 2024-04-04 | Disposition: A | Discharge status: Home / Self Care | Attending: Emergency Medicine | Admitting: Emergency Medicine

## 2024-04-04 ENCOUNTER — Emergency Department

## 2024-04-04 ENCOUNTER — Other Ambulatory Visit: Payer: Self-pay

## 2024-04-04 DIAGNOSIS — M25511 Pain in right shoulder: Secondary | ICD-10-CM | POA: Insufficient documentation

## 2024-04-04 DIAGNOSIS — S46011A Strain of muscle(s) and tendon(s) of the rotator cuff of right shoulder, initial encounter: Secondary | ICD-10-CM | POA: Diagnosis not present

## 2024-04-04 DIAGNOSIS — I10 Essential (primary) hypertension: Secondary | ICD-10-CM | POA: Insufficient documentation

## 2024-04-04 DIAGNOSIS — X58XXXA Exposure to other specified factors, initial encounter: Secondary | ICD-10-CM | POA: Insufficient documentation

## 2024-04-04 DIAGNOSIS — Z79899 Other long term (current) drug therapy: Secondary | ICD-10-CM | POA: Diagnosis not present

## 2024-04-04 MED ORDER — OXYCODONE-ACETAMINOPHEN 5-325 MG PO TABS
1.0000 | ORAL_TABLET | Freq: Once | ORAL | Status: AC
  Administered 2024-04-04: 1 via ORAL
  Filled 2024-04-04: qty 1

## 2024-04-04 MED ORDER — OXYCODONE-ACETAMINOPHEN 5-325 MG PO TABS: 1.0000 | ORAL_TABLET | ORAL | 0 refills | Status: AC | PRN

## 2024-04-04 NOTE — ED Triage Notes (Signed)
 Pt comes with right shoulder pain that started yesterday. Pt denies any known injuries the patient states 8/10 pain but if she moves it is a 20. Pt thinks she broke it.

## 2024-04-04 NOTE — ED Triage Notes (Signed)
 Pt states she was seen here earlier for right shoulder pain dx with rotator cuff injury and sent home with rx for percocet, pt states medication is not helping with pain.

## 2024-04-04 NOTE — ED Notes (Signed)
 See triage notes. Patient c/o right shoulder pain. Patient stated she woke up yesterday with the pain. Denies current injury but states she has injured it in the past. Requesting pain medications.

## 2024-04-04 NOTE — ED Provider Notes (Signed)
 [facility] Provider Note   Event Date/Time   First MD Initiated Contact with Patient 04/04/24 [phone]     (approximate) History  Shoulder Injury  HPI Holly Ponce is a 48 y.o. female with a stated past medical history of right shoulder injury who presents complaining of of 8/10 right shoulder pain that is worse with movement.  Patient denies any recent trauma and states that she woke up 2 days ago with this pain that has not improved since onset.  Patient endorses pain in the abduction and forward motion at the shoulder however backward motion does not hurt as bad. ROS: Patient currently denies any vision changes, tinnitus, difficulty speaking, facial droop, sore throat, chest pain, shortness of breath, abdominal pain, nausea/vomiting/diarrhea, dysuria, or weakness/numbness/paresthesias in any extremity   Physical Exam  Triage Vital Signs: ED Triage Vitals  Encounter Vitals Group     BP 04/04/24 0738 132/82     Systolic BP Percentile --      Diastolic BP Percentile --      Pulse Rate 04/04/24 0738 79     Resp 04/04/24 0738 18     Temp 04/04/24 0738 98.7 F (37.1 C)     Temp src --      SpO2 04/04/24 0738 99 %     Weight 04/04/24 0737 260 lb (117.9 kg)     Height 04/04/24 0737 5\' 5"  (1.651 m)     Head Circumference --      Peak Flow --      Pain Score 04/04/24 0737 8     Pain Loc --      Pain Education --      Exclude from Growth Chart --    Most recent vital signs: Vitals:   04/04/24 0738  BP: 132/82  Pulse: 79  Resp: 18  Temp: 98.7 F (37.1 C)  SpO2: 99%   General: Awake, oriented x4. CV:  Good peripheral perfusion.  Resp:  Normal effort.  Abd:  No distention.  Other:  Middle-aged obese African-American female resting in a stretcher in moderate distress secondary to pain at the right shoulder.  Patient is tender to palpation over the anterior lateral deltoid region with significant pain with range of motion at the right shoulder ED Results /  Procedures / Treatments  Labs (all labs ordered are listed, but only abnormal results are displayed) Labs Reviewed - No data to display RADIOLOGY ED MD interpretation: X-ray of the right shoulder independently interpreted and shows no evidence of acute abnormalities -Agree with radiology assessment Official radiology report(s): DG Shoulder Right Result Date: 04/04/2024 CLINICAL DATA:  Right shoulder pain. EXAM: RIGHT SHOULDER - 2+ VIEW COMPARISON:  None Available. FINDINGS: No signs of acute fracture or dislocation. No significant arthropathy. The visualized right ribs appear intact. IMPRESSION: Negative. Electronically Signed   By: Kimberley Penman M.D.   On: 04/04/2024 08:15   PROCEDURES: Critical Care performed: No Procedures MEDICATIONS ORDERED IN ED: Medications  oxyCODONE -acetaminophen  (PERCOCET/ROXICET) 5-325 MG per tablet 1 tablet (1 tablet Oral Given 04/04/24 0807)   IMPRESSION / MDM / ASSESSMENT AND PLAN / ED COURSE  I reviewed the triage vital signs and the nursing notes.                             The patient is on the cardiac monitor to evaluate for evidence of arrhythmia and/or significant heart rate changes. Patient's presentation is most consistent with  acute presentation with potential threat to life or bodily function. 48 year old obese African-American female presents for right shoulder pain over the last 2 days in the setting of no new trauma Given history, exam and workup I have low suspicion for fracture, dislocation, significant ligamentous injury, septic arthritis, gout flare, new autoimmune arthropathy, or gonococcal arthropathy.  Interventions: Right shoulder sling, analgesic prescription, orthopedic follow-up Disposition: Discharge home with strict return precautions and instructions for prompt primary care follow up in the next week.   FINAL CLINICAL IMPRESSION(S) / ED DIAGNOSES   Final diagnoses:  Acute pain of right shoulder   Rx / DC Orders   ED  Discharge Orders          Ordered    oxyCODONE -acetaminophen  (PERCOCET) 5-325 MG tablet  Every 4 hours PRN        04/04/24 0800           Note:  This document was prepared using Dragon voice recognition software and may include unintentional dictation errors.   Dene Nazir K, MD 04/04/24 [phone]

## 2024-04-05 ENCOUNTER — Emergency Department
Admission: EM | Admit: 2024-04-05 | Discharge: 2024-04-05 | Disposition: A | Discharge status: Home / Self Care | Attending: Emergency Medicine | Admitting: Emergency Medicine

## 2024-04-05 DIAGNOSIS — S46011A Strain of muscle(s) and tendon(s) of the rotator cuff of right shoulder, initial encounter: Secondary | ICD-10-CM

## 2024-04-05 DIAGNOSIS — M25511 Pain in right shoulder: Secondary | ICD-10-CM

## 2024-04-05 MED ORDER — CYCLOBENZAPRINE HCL 10 MG PO TABS
5.0000 mg | ORAL_TABLET | Freq: Once | ORAL | Status: AC
Start: 2024-04-05 — End: 2024-04-05
  Administered 2024-04-05: 5 mg via ORAL
  Filled 2024-04-05: qty 1

## 2024-04-05 MED ORDER — KETOROLAC TROMETHAMINE 60 MG/2ML IM SOLN
30.0000 mg | Freq: Once | INTRAMUSCULAR | Status: AC
  Administered 2024-04-05: 30 mg via INTRAMUSCULAR
  Filled 2024-04-05: qty 2

## 2024-04-05 MED ORDER — HYDROMORPHONE HCL 1 MG/ML IJ SOLN
2.0000 mg | Freq: Once | INTRAMUSCULAR | Status: AC
  Administered 2024-04-05: 2 mg via INTRAMUSCULAR
  Filled 2024-04-05: qty 2

## 2024-04-05 MED ORDER — HYDROMORPHONE HCL 2 MG PO TABS: 2.0000 mg | ORAL_TABLET | Freq: Four times a day (QID) | ORAL | 0 refills | Status: AC | PRN

## 2024-04-05 MED ORDER — CYCLOBENZAPRINE HCL 5 MG PO TABS: ORAL_TABLET | ORAL | 0 refills | Status: AC

## 2024-04-05 NOTE — Discharge Instructions (Signed)
 Alternate your pain medicines every 4 hours.  Add Flexeril  as needed for muscle spasms.  Wear sling as needed for comfort.  Return to the ER for worsening symptoms, persistent vomiting, difficulty breathing or other concerns.

## 2024-04-05 NOTE — ED Provider Notes (Signed)
 [facility] Provider Note    Event Date/Time   First MD Initiated Contact with Patient 04/05/24 [phone]     (approximate)   History   Shoulder Pain   HPI  Holly Ponce is a 48 y.o. female who returns to the ED from home with continued right shoulder pain.  Patient was seen in the ED yesterday morning for same with negative x-rays.  No trauma/injury.  Diagnosed with rotator cuff strain, placed in sling and discharged home on Percocet which patient states the medication is not helping.  Denies extremity weakness/numbness/tingling.  Voices no other complaints or injuries.     Past Medical History   Past Medical History:  Diagnosis Date  . Hypertension   . Sleep apnea      Active Problem List   Patient Active Problem List   Diagnosis Date Noted  . Rhabdomyolysis 03/31/2016     Past Surgical History   Past Surgical History:  Procedure Laterality Date  . ABDOMINAL HYSTERECTOMY       Home Medications   Prior to Admission medications   Medication Sig Start Date End Date Taking? Authorizing Provider  alum & mag hydroxide-simeth (MAALOX MAX) 400-400-40 MG/5ML suspension Take 5 mLs by mouth every 6 (six) hours as needed for indigestion. 05/21/22   Isa Manuel, MD  erythromycin  ophthalmic ointment Place 1 application into the right eye 4 (four) times daily. For 5 days 04/16/21   Ward, Clover Dao, DO  ibuprofen  (ADVIL ,MOTRIN ) 400 MG tablet Take 400 mg by mouth every 6 (six) hours as needed.    [provider]  Multiple Vitamin (MULTIVITAMIN) tablet Take 1 tablet by mouth daily.    [provider]  naproxen  (NAPROSYN ) 500 MG tablet Take 1 tablet (500 mg total) by mouth 2 (two) times daily with a meal. 07/01/21   Jacquie Maudlin, MD  ondansetron  (ZOFRAN -ODT) 4 MG disintegrating tablet Take 1 tablet (4 mg total) by mouth every 8 (eight) hours as needed for nausea or vomiting. 05/21/22   Isa Manuel, MD  oseltamivir  (TAMIFLU ) 75  MG capsule Take 1 capsule (75 mg total) by mouth 2 (two) times daily. 11/07/21   Graylon Amory J, MD  oxyCODONE -acetaminophen  (PERCOCET) 5-325 MG tablet Take 1 tablet by mouth every 4 (four) hours as needed for severe pain (pain score 7-10). 04/04/24   Bradler, Evan K, MD     Allergies  Tramadol and Vicodin [hydrocodone-acetaminophen ]   Family History  History reviewed. No pertinent family history.   Physical Exam  Triage Vital Signs: ED Triage Vitals  Encounter Vitals Group     BP 04/04/24 2326 (!) 136/95     Systolic BP Percentile --      Diastolic BP Percentile --      Pulse Rate 04/04/24 2326 74     Resp 04/04/24 2326 18     Temp 04/04/24 2326 98.6 F (37 C)     Temp Source 04/04/24 2326 Oral     SpO2 04/04/24 2326 98 %     Weight 04/04/24 2325 260 lb (117.9 kg)     Height 04/04/24 2325 5\' 5"  (1.651 m)     Head Circumference --      Peak Flow --      Pain Score 04/04/24 2325 10     Pain Loc --      Pain Education --      Exclude from Growth Chart --     Updated Vital Signs: BP (!) 136/95  Pulse 74   Temp 98.6 F (37 C) (Oral)   Resp 18   Ht 5\' 5"  (1.651 m)   Wt 117.9 kg   LMP 11/30/2017   SpO2 98%   BMI 43.27 kg/m    General: Awake, no distress.  CV:  Good peripheral perfusion.  Resp:  Normal effort.  Abd:  No distention.  Other:  Right shoulder: Tender to palpation anteriorly.  Limited range of motion secondary to pain.  No deformities noted.  Equal grip strength.  2+ radial pulse.  Brisk, less than 5-second cap refill.   ED Results / Procedures / Treatments  Labs (all labs ordered are listed, but only abnormal results are displayed) Labs Reviewed - No data to display   EKG  None   RADIOLOGY None   Official radiology report(s): DG Shoulder Right Result Date: 04/04/2024 CLINICAL DATA:  Right shoulder pain. EXAM: RIGHT SHOULDER - 2+ VIEW COMPARISON:  None Available. FINDINGS: No signs of acute fracture or dislocation. No significant  arthropathy. The visualized right ribs appear intact. IMPRESSION: Negative. Electronically Signed   By: Kimberley Penman M.D.   On: 04/04/2024 08:15     PROCEDURES:  Critical Care performed: No  Procedures   MEDICATIONS ORDERED IN ED: Medications  ketorolac  (TORADOL ) injection 30 mg (has no administration in time range)  HYDROmorphone  (DILAUDID ) injection 2 mg (has no administration in time range)  cyclobenzaprine  (FLEXERIL ) tablet 5 mg (has no administration in time range)     IMPRESSION / MDM / ASSESSMENT AND PLAN / ED COURSE  I reviewed the triage vital signs and the nursing notes.                             48 year old female presenting with continued pain to right shoulder.  Will administer IM Ketorolac /Dilaudid , add Flexeril  for muscle relaxation.  Will reassess.  Patient's presentation is most consistent with acute, uncomplicated illness.  Clinical Course as of 04/05/24 0620  Tue Apr 05, 2024  0456 Patient reports pain is significantly better.  Will discharge home on Flexeril  and Dilaudid .  Have asked patient to alternate Percocet and Dilaudid  every 4 hours but not to take both at the same time.  Education regarding proper sling use given.  Patient will follow-up with orthopedics per original plan.  Strict return precautions given.  Patient and spouse verbalized understanding and agreed with plan of care. [JS]    Clinical Course User Index [JS] Norlene Beavers, MD   FINAL CLINICAL IMPRESSION(S) / ED DIAGNOSES   Final diagnoses:  Acute pain of right shoulder  Rotator cuff strain, right, initial encounter     Rx / DC Orders   ED Discharge Orders     None        Note:  This document was prepared using Dragon voice recognition software and may include unintentional dictation errors.   Norlene Beavers, MD 04/05/24 [phone]

## 2024-09-21 ENCOUNTER — Telehealth: Payer: Self-pay | Admitting: Family Medicine

## 2024-09-21 NOTE — Telephone Encounter (Signed)
 Patient has question about HIV exposure.

## 2024-09-23 ENCOUNTER — Ambulatory Visit

## 2024-09-23 DIAGNOSIS — B3731 Acute candidiasis of vulva and vagina: Secondary | ICD-10-CM

## 2024-09-23 DIAGNOSIS — Z113 Encounter for screening for infections with a predominantly sexual mode of transmission: Secondary | ICD-10-CM | POA: Diagnosis not present

## 2024-09-23 DIAGNOSIS — Z206 Contact with and (suspected) exposure to human immunodeficiency virus [HIV]: Secondary | ICD-10-CM

## 2024-09-23 LAB — WET PREP FOR TRICH, YEAST, CLUE
Clue Cell Exam: NEGATIVE
Trichomonas Exam: NEGATIVE

## 2024-09-23 LAB — HM HEPATITIS C SCREENING LAB: HM Hepatitis Screen: NEGATIVE

## 2024-09-23 LAB — HEPATITIS B SURFACE ANTIGEN

## 2024-09-23 LAB — HM HIV SCREENING LAB: HM HIV Screening: NEGATIVE

## 2024-09-23 MED ORDER — FLUCONAZOLE 150 MG PO TABS: 150.0000 mg | ORAL_TABLET | Freq: Once | ORAL | Status: AC

## 2024-09-23 NOTE — Progress Notes (Signed)
 [facility] Department STI clinic [address] [place] [address] Main phone: [phone]  STI screening visit  Subjective:  Holly Ponce is a 48 y.o. female being seen today for an STI screening visit. The patient reports they do have symptoms.    Patient has the following medical conditions:  Patient Active Problem List   Diagnosis Date Noted   Rhabdomyolysis 03/31/2016   Chief Complaint  Patient presents with   SEXUALLY TRANSMITTED DISEASE   HPI Patient reports thick, white clumpy discharge for several weeks, getting worse. Also endorses vaginal itching and irritation. Last sex was 09/09/24 without a condom. Her partner has other partners, and she believes he had sexual contact with a woman with HIV 3 weeks ago. Discussed we can do an HIV test today, but I would recommend repeat HIV testing in 3 weeks and again in 6 weeks. She reports she does not plan continued sexual contact with this partner.  See flowsheet for further details and programmatic requirements  Hyperlink available at the top of the signed note in blue.  Flow sheet content below:  Pregnancy Intention Screening Does the patient want to become pregnant in the next year?: No Does the patient's partner want to become pregnant in the next year?: No Would the patient like to discuss contraceptive options today?: No Reason For STD Screen STD Screening: Has symptoms STD Symptoms Denies all: No Genital Itching: Yes Genital Itch s/s: no Lower abdominal pain: No Discharge: Yes Dysuria: No Genital ulcer / lesion: No Rash: No Vaginal irritation: Yes Oral / Other skin ulcer: No Pain with sex: No Sore Throat: No Visual Changes: No Vaginal Bleeding: No Risk Factors for Hep B Household, sexual, or needle sharing contact of a person infected with Hep B: No Sexual contact with a person who uses drugs not as prescribed?: No Currently or Ever used drugs not as prescribed: No HIV  Positive: No PRep Patient: No Men who have sex with men: No Have Hepatitis C: No History of Incarceration: No History of Homeslessness?: No Anal sex following anal drug use?: No Risk Factors for Hep C Currently using drugs not as prescribed: No Sexual partner(s) currently using drugs as not prescribed: No History of drug use: No HIV Positive: No People with a history of incarceration: No People born between the years of 43 and 20: No Counseling Contact card(s) given to patient: Yes Patient counseled to abstain from sex for?: 10 days post partner's treatment Patient counseled to avoid alcohol for?: 5 days Patient counseled to use condoms with all sex: Condoms given RTC in 2-3 weeks for test results: Yes Clinic will call if test results abnormal before test result appt.: Yes Patient should return to the clinic for re-treatment if vomits within 2 hours after taking meds   : Yes Test results given to patient STD Results:  (yeast infection) Patient questions answered: Yes Patient counseled to use condoms with all sex: Condoms given STD Treatment Patient counseled to abstain from sex for?: 10 days post partner's treatment Patient counseled to avoid alcohol for?: 5 days  Screening for MPX risk:  Unexplained rash?  No   MSM?  No   Multiple or anonymous sex partners?  No   Any close or sexual contact with a person  diagnosed with MPX?  No   Any outside the [place]  where MPX is endemic?  No   High clinical suspicion for MPX?    -Unlikely to be chickenpox    -Lymphadenopathy    -  Rash that presents in same phase of       evolution on any given body part  No   STI screening history: Last HIV test per patient/review of record was No results found for: HMHIVSCREEN  Lab Results  Component Value Date   HIV NON REACTIVE 04/02/2016    Last HEPC test per patient/review of record was No results found for: HMHEPCSCREEN No components found for: HEPC   Last HEPB test per  patient/review of record was No components found for: HMHEPBSCREEN   Fertility: Does the patient or their partner desires a pregnancy in the next year? No  Immunization History  Administered Date(s) Administered   Moderna Sars-Covid-2 Vaccination 08/19/2020, 09/24/2020   PPD Test 05/18/2023    The following portions of the patient's history were reviewed and updated as appropriate: allergies, current medications, past medical history, past social history, past surgical history and problem list.  Objective:  There were no vitals filed for this visit.  Physical Exam Vitals and nursing note reviewed. Exam conducted with a chaperone present Brett Orange).  Constitutional:      Appearance: Normal appearance.  HENT:     Head: Normocephalic and atraumatic.     Mouth/Throat:     Mouth: Mucous membranes are moist.     Pharynx: Oropharynx is clear. No oropharyngeal exudate or posterior oropharyngeal erythema.  Pulmonary:     Effort: Pulmonary effort is normal.  Abdominal:     General: Abdomen is flat.     Palpations: There is no mass.     Tenderness: There is no abdominal tenderness. There is no rebound.  Genitourinary:    General: Normal vulva.     Exam position: Lithotomy position.     Pubic Area: No rash or pubic lice.      Labia:        Right: No rash or lesion.        Left: No rash or lesion.      Vagina: Vaginal discharge present. No erythema, bleeding or lesions.     Cervix: Erythema present. No cervical motion tenderness, discharge, friability or lesion.     Comments: pH = <4.5 Copious thick, white/yellow discharge Lymphadenopathy:     Head:     Right side of head: No preauricular or posterior auricular adenopathy.     Left side of head: No preauricular or posterior auricular adenopathy.     Cervical: No cervical adenopathy.     Upper Body:     Right upper body: No supraclavicular, axillary or epitrochlear adenopathy.     Left upper body: No supraclavicular, axillary or  epitrochlear adenopathy.  Skin:    General: Skin is warm and dry.     Findings: No rash.  Neurological:     Mental Status: She is alert and oriented to person, place, and time.    Assessment and Plan:  Holly Ponce is a 48 y.o. female presenting to the [facility] Department for STI screening  1. Screening for venereal disease (Primary)  - Syphilis Serology, [place] [facility] - HIV/HCV [place] [facility] - WET PREP FOR TRICH, YEAST, CLUE - Chlamydia/GC NAA, Confirmation - HBV Antigen/Antibody State Lab  2. Vulvovaginal candidiasis  - fluconazole (DIFLUCAN) 150 MG tablet; Take 1 tablet (150 mg total) by mouth once for 1 dose.  3. HIV Exposure  - Patient was told by her partner's other sexual partner that she (the other partner) is HIV positive - Last sex with her partner was 9.26 -  Discussed that it takes around 3 weeks for HIV to be detected on testing - Advised repeat HIV testing in 3 weeks and again in 6 weeks - She reports she is not planning to continue sexual relationship with this partner, condoms highly encouraged if she does. She could also consider pREP if she continues relationship.  Patient does have STI symptoms Patient accepted the following screenings: oral GC culture, vaginal CT/GC swab, vaginal wet prep, HIV, RPR, Hep B, and Hep C Patient meets criteria for HepB screening? Yes. Ordered? yes Patient meets criteria for HepC screening? Yes. Ordered? yes Recommended condom use with all sex Discussed importance of condom use for STI prevention  Treat positive test results per standing order. Discussed time line for State Lab results and that patient will be called with positive results and encouraged patient to call if he had not heard in 2 weeks Recommended repeat testing in 3 months with positive results. Recommended returning for continued or worsening symptoms.   Return in about 3 weeks (around 10/14/2024).  No future appointments.  Damien FORBES Satchel, NP

## 2024-09-23 NOTE — Progress Notes (Signed)
 Pt is here for STD screening. Wet prep results reviewed with patient and was dispensed Diflucan 150 mg tablet orally once. I provided counseling today regarding the medication, the side effects and when to call clinic.Condoms given. Kwadwo Ronell Boldin,RN.

## 2024-09-27 LAB — GONOCOCCUS CULTURE

## 2024-12-26 NOTE — Addendum Note (Signed)
 Addended by: Amaru Burroughs on: 12/26/2024 03:15 PM   Modules accepted: Orders
